# Patient Record
Sex: Male | Born: 1955
Health system: Southern US, Community
[De-identification: ages and names within clinical notes are randomized; demographics above are authoritative.]

## PROBLEM LIST (undated history)

## (undated) DIAGNOSIS — Z87442 Personal history of urinary calculi: Secondary | ICD-10-CM

## (undated) DIAGNOSIS — I10 Essential (primary) hypertension: Secondary | ICD-10-CM

## (undated) DIAGNOSIS — M109 Gout, unspecified: Secondary | ICD-10-CM

## (undated) DIAGNOSIS — E7800 Pure hypercholesterolemia, unspecified: Secondary | ICD-10-CM

## (undated) HISTORY — PX: COLONOSCOPY: SHX174

## (undated) HISTORY — PX: CARDIAC CATHETERIZATION: SHX172

---

## 2003-12-22 ENCOUNTER — Ambulatory Visit (HOSPITAL_COMMUNITY): Admission: RE | Admit: 2003-12-22 | Discharge: 2003-12-22 | Payer: Self-pay | Admitting: Family Medicine

## 2005-03-25 ENCOUNTER — Ambulatory Visit (HOSPITAL_COMMUNITY): Admission: RE | Admit: 2005-03-25 | Discharge: 2005-03-25 | Payer: Self-pay | Admitting: General Surgery

## 2006-02-05 ENCOUNTER — Ambulatory Visit (HOSPITAL_COMMUNITY): Admission: RE | Admit: 2006-02-05 | Discharge: 2006-02-05 | Payer: Self-pay | Admitting: Family Medicine

## 2006-12-19 ENCOUNTER — Ambulatory Visit (HOSPITAL_COMMUNITY): Admission: RE | Admit: 2006-12-19 | Discharge: 2006-12-19 | Payer: Self-pay | Admitting: *Deleted

## 2009-05-13 ENCOUNTER — Emergency Department (HOSPITAL_COMMUNITY): Admission: EM | Admit: 2009-05-13 | Discharge: 2009-05-13 | Payer: Self-pay | Admitting: Emergency Medicine

## 2010-03-13 IMAGING — CT CT PELVIS W/O CM
1 of 2 series · 8 of 32 positions shown, 13 images · non-contrast
Comparison: None.

CT ABDOMEN

CLINICAL DATA: Right-sided flank pain.  History of urinary tract
calculi.

CT ABDOMEN AND PELVIS WITHOUT CONTRAST 05/13/2009:
TECHNIQUE: Multidetector CT imaging of the abdomen and pelvis was
performed following the standard protocol without intravenous
contrast.

[Series 2: standard/full over (age)lbs 5.0 · axial · 0.73mm/px · z∈[+652,+1036]mm · 8 of 100 slices shown, 13 images]
[im 12/100  soft-tissue]
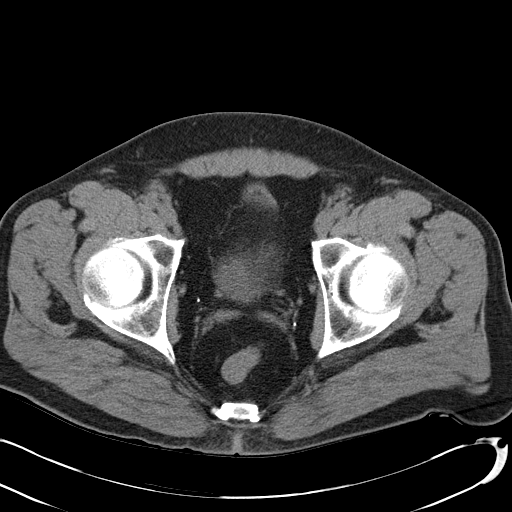
[im 12/100  bone]
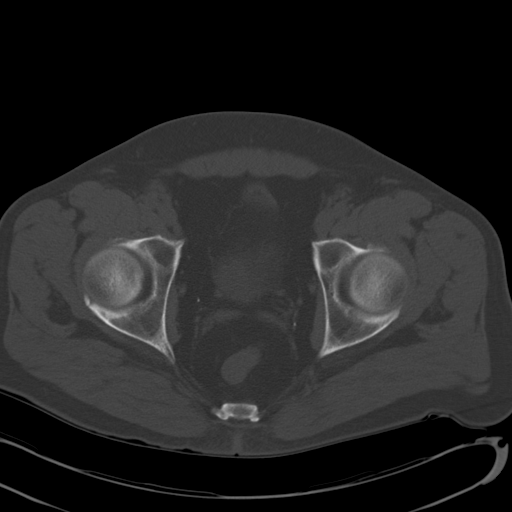
[im 23/100  soft-tissue]
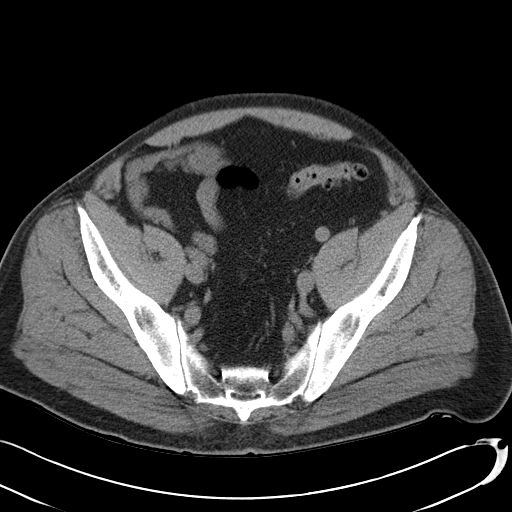
[im 34/100  soft-tissue]
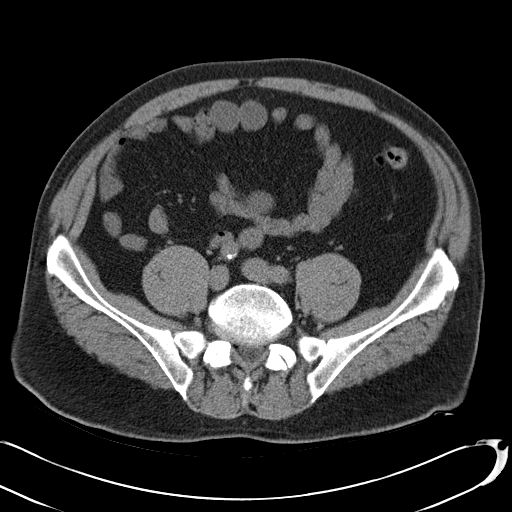
[im 45/100  soft-tissue]
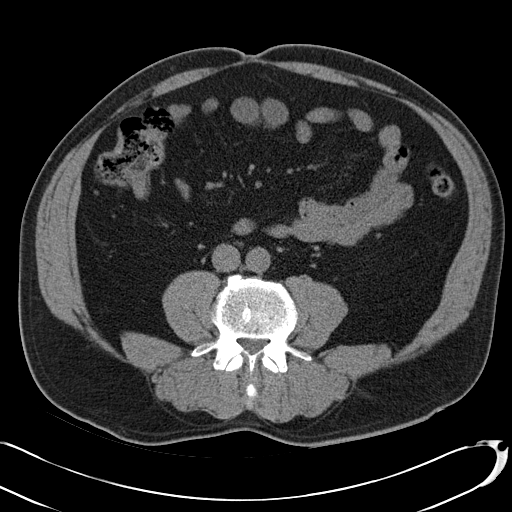
[im 56/100  soft-tissue]
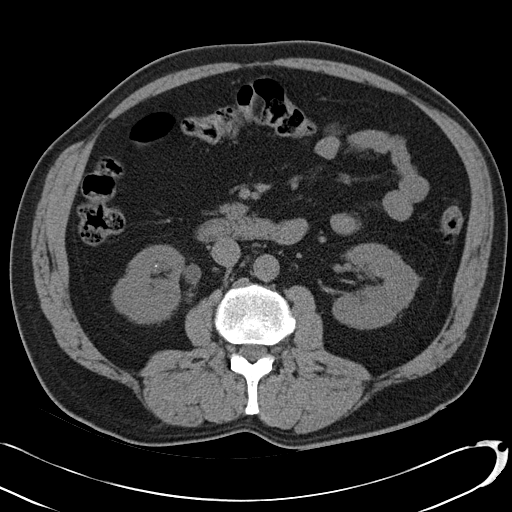
[im 56/100  lung]
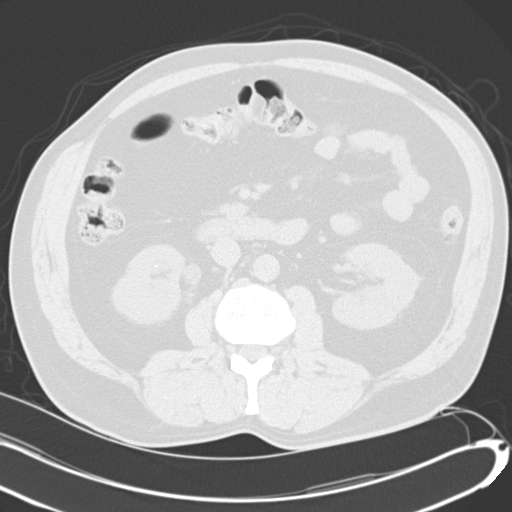
[im 67/100  soft-tissue]
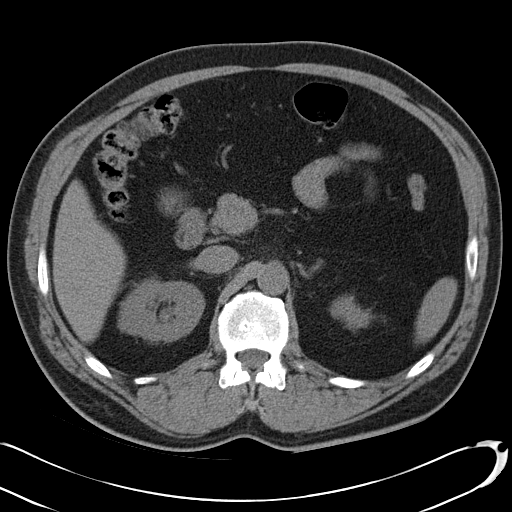
[im 67/100  lung]
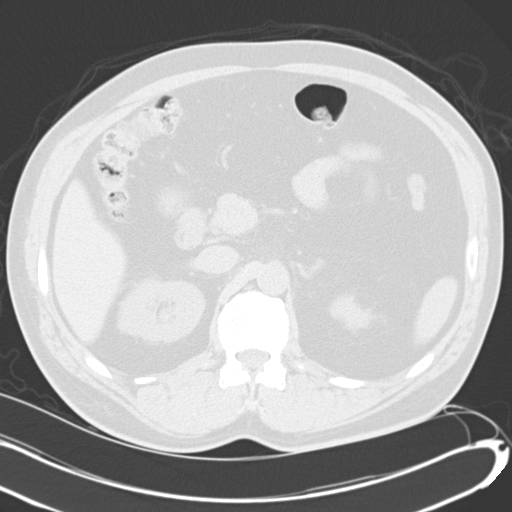
[im 78/100  soft-tissue]
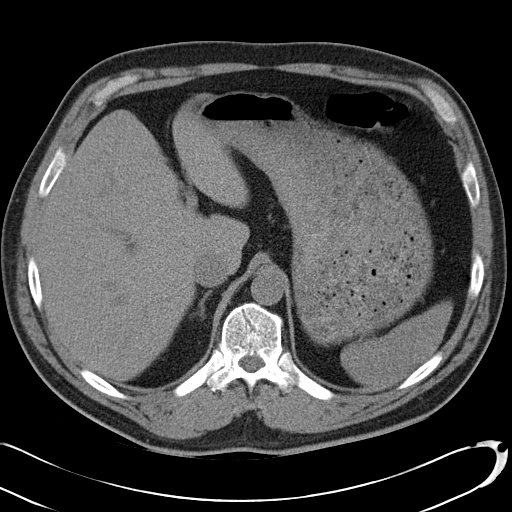
[im 78/100  lung]
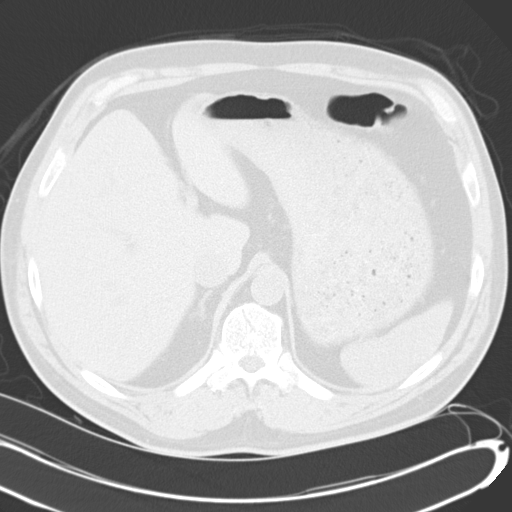
[im 89/100  soft-tissue]
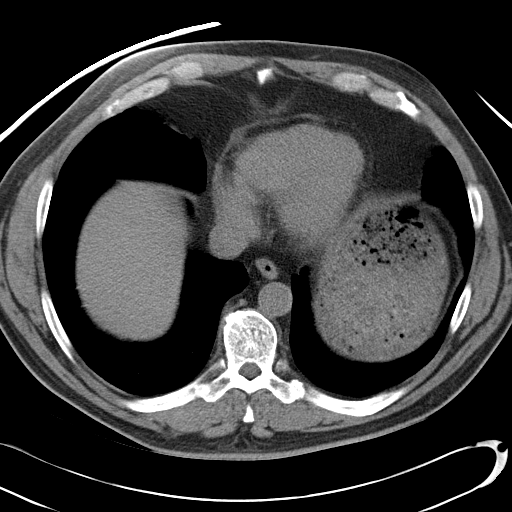
[im 89/100  lung]
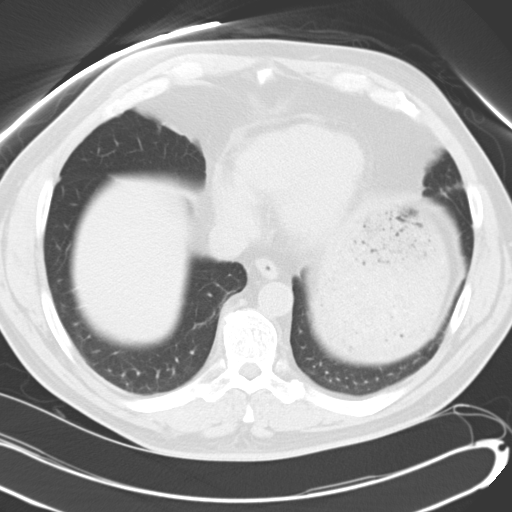

[8 of 32 positions shown; findings below may reference images not displayed]

FINDINGS: Approximate 4 mm proximal right ureteral calculus,
causing moderate right hydronephrosis, right renal edema, and mild
right perinephric edema.  Tiny (approximate 1-2 mm) calculus in a
lower pole calix of the right kidney.  Similar tiny calculi in
adjacent lower pole calyces of the left kidney and in a mid left
renal calix. No proximal left ureteral calculi.  No evidence of
left upper urinary tract obstruction.  Within the limits of the
unenhanced technique, no focal parenchymal abnormality involving
either kidney.

Normal unenhanced appearance of the liver, spleen, pancreas,
adrenal glands, and gallbladder.  No biliary ductal dilation.
Large amount of food within the stomach which is otherwise
unremarkable.  Visualized small bowel unremarkable.  Descending
colon diverticulosis without evidence of acute diverticulitis.
Mild to moderate abdominal aortic atherosclerosis without aneurysm.
No significant lymphadenopathy.  No ascites.  Visualized lung bases
clear apart from minimal scarring in the lingula.  Bone window
images demonstrate mild degenerative changes in the lumbar spine
and likely lower lumbar spinal stenosis related to congenitally
short pedicles.
IMPRESSION: 1.  Obstructing approximate 4 mm proximal right ureteral calculus.
2.  Tiny bilateral renal calculi.  No evidence of left upper
urinary tract obstruction.
3.  Descending colon diverticulosis.

CT PELVIS
FINDINGS: No distal ureteral calculi on either side.  Urinary
bladder decompressed and unremarkable.  Numerous pelvic
phleboliths.  Sigmoid colon diverticulosis without evidence of
acute diverticulitis.  Visualized small bowel unremarkable.  Mild
iliofemoral atherosclerosis.  No significant lymphadenopathy.  No
ascites.  Bone window images demonstrate mild degenerative changes
in the sacroiliac joints.
IMPRESSION: 1.  No distal ureteral calculi on either side.
2.  No acute abnormalities in the pelvis.
3.  Sigmoid colon diverticulosis.

## 2010-03-31 ENCOUNTER — Emergency Department (HOSPITAL_BASED_OUTPATIENT_CLINIC_OR_DEPARTMENT_OTHER): Admission: EM | Admit: 2010-03-31 | Discharge: 2010-03-31 | Payer: Self-pay | Admitting: Emergency Medicine

## 2010-10-10 LAB — CBC
Platelets: 236 10*3/uL (ref 150–400)
WBC: 8.5 10*3/uL (ref 4.0–10.5)

## 2010-10-10 LAB — URINALYSIS, ROUTINE W REFLEX MICROSCOPIC
Bilirubin Urine: NEGATIVE
Leukocytes, UA: NEGATIVE
Specific Gravity, Urine: 1.015 (ref 1.005–1.030)
Urobilinogen, UA: 0.2 mg/dL (ref 0.0–1.0)
pH: 7 (ref 5.0–8.0)

## 2010-10-10 LAB — BASIC METABOLIC PANEL
BUN: 17 mg/dL (ref 6–23)
Calcium: 9.1 mg/dL (ref 8.4–10.5)
Chloride: 99 mEq/L (ref 96–112)
Creatinine, Ser: 1.22 mg/dL (ref 0.4–1.5)
GFR calc Af Amer: >60 mL/min (ref >60)
GFR calc non Af Amer: >60 mL/min (ref >60)
Glucose, Bld: 115 mg/dL — ABNORMAL HIGH (ref 70–99)
Sodium: 136 mEq/L (ref 135–145)

## 2010-10-10 LAB — DIFFERENTIAL
Basophils Relative: 1 % (ref 0–1)
Eosinophils Absolute: 0.2 10*3/uL (ref 0.0–0.7)
Eosinophils Relative: 3 % (ref 0–5)
Lymphs Abs: 2.8 10*3/uL (ref 0.7–4.0)

## 2010-10-10 LAB — URINE MICROSCOPIC-ADD ON

## 2010-11-23 NOTE — H&P (Signed)
NAME:  Spencer Serrano, Spencer Serrano NO.:  [id number]   MEDICAL RECORD NO.:  [id number]           PATIENT TYPE:   LOCATION:                                 FACILITY:   PHYSICIAN:  Dalia Heading, M.D.  DATE OF BIRTH:  12/31/1955   DATE OF ADMISSION:  DATE OF DISCHARGE:  LH                                HISTORY & PHYSICAL   CHIEF COMPLAINT:  Need for screening colonoscopy.   HISTORY OF PRESENT ILLNESS:  The patient is a 55 year old white male who was  referred for endoscopic evaluation.  He needs his colonoscopy for screening  purposes.  No abdominal pain, weight loss, nausea, vomiting, diarrhea,  constipation, melena or hematochezia has been noted.  He has never had a  colonoscopy.  There is no family history of colon carcinoma.   PAST MEDICAL HISTORY:  Includes hypertension.   PAST SURGICAL HISTORY:  Unremarkable.   CURRENT MEDICATIONS:  A baby aspirin, lisinopril, Vytorin.   ALLERGIES:  PENICILLIN.   REVIEW OF SYSTEMS:  Noncontributory.   PHYSICAL EXAMINATION:  GENERAL APPEARANCE:  The patient is a well-developed,  well-nourished white male in no acute distress.  VITAL SIGNS:  He is afebrile, and the vital signs are stable.  LUNGS:  Clear to auscultation with equal breath sounds bilaterally.  HEART:  Reveals a regular rate and rhythm without S3, S4 or murmurs.  ABDOMEN:  Soft, nontender, nondistended.  No hepatosplenomegaly or masses  are noted.  RECTAL:  Examination was deferred to the procedure.   IMPRESSION:  Need for screening colonoscopy.   PLAN:  The patient is scheduled for a colonoscopy on March 25, 2005.  The risks and benefits of the procedure including bleeding and perforation  were fully explained to the patient, who gave informed consent.      Dalia Heading, M.D.  Electronically Signed     MAJ/MEDQ  D:  03/12/2005  T:  03/12/2005  Job:  [id number]   cc:   Dalia Heading, M.D.  [address] [place]  [place]  [place] [address]  Fax:  [phone]   Corrie Mckusick, M.D.  Fax: [phone]

## 2014-09-07 ENCOUNTER — Telehealth: Payer: Self-pay

## 2014-09-07 NOTE — Telephone Encounter (Signed)
Pt received letter from DS to set up his colonoscopy. He can be reached at [phone] and will have to leave to pick up his grandchild from school around 230pm.

## 2014-09-07 NOTE — Telephone Encounter (Signed)
PT is scheduled for OV with Neil Crouch, PA on 09/19/2014 at 10:00 AM. He said he had a colonoscopy about 10 years ago and no problems. Looks like Dr. Arnoldo Morale did [facility].  Will call medical records for the colonoscopy report.  PT was referred by Dr. Hilma Favors and referral is with his appt info.

## 2014-09-08 NOTE — Telephone Encounter (Signed)
Requested the colonoscopy report from [place] [facility] S[facility]).

## 2014-09-19 ENCOUNTER — Ambulatory Visit: Payer: Self-pay | Admitting: Gastroenterology

## 2014-09-20 ENCOUNTER — Ambulatory Visit (INDEPENDENT_AMBULATORY_CARE_PROVIDER_SITE_OTHER): Payer: 59 | Admitting: Gastroenterology

## 2014-09-20 ENCOUNTER — Encounter: Payer: Self-pay | Admitting: Gastroenterology

## 2014-09-20 ENCOUNTER — Other Ambulatory Visit: Payer: Self-pay

## 2014-09-20 VITALS — BP 125/79 | HR 64 | Temp 98.3°F | Ht 74.0 in | Wt 237.4 lb

## 2014-09-20 DIAGNOSIS — Z1211 Encounter for screening for malignant neoplasm of colon: Secondary | ICD-10-CM

## 2014-09-20 MED ORDER — PEG-KCL-NACL-NASULF-NA ASC-C 100 G PO SOLR: 1.0000 | ORAL | Status: DC

## 2014-09-20 NOTE — Patient Instructions (Signed)
We have triaged you today and refunded your copay.   Colonoscopy to be scheduled with Dr. Gala Romney.

## 2014-09-20 NOTE — Progress Notes (Signed)
Gastroenterology Pre-Procedure Review  Request Date: Requesting Physician: Hilma Favors  PATIENT REVIEW QUESTIONS: The patient responded to the following health history questions as indicated:    1. Diabetes Melitis: NO 2. Joint replacements in the past 12 months: no 3. Major health problems in the past 3 months: no 4. Has an artificial valve or MVP: no 5. Has a defibrillator: no 6. Has been advised in past to take antibiotics in advance of a procedure like teeth cleaning: no 7. Family history: No 8. Alcohol: NO  MEDICATIONS & ALLERGIES:    Patient reports the following regarding taking any blood thinners:   Plavix? NO Aspirin? YES, BABY ASA 81 MG Coumadin? NO  Patient confirms/reports the following medications:  Current Outpatient Prescriptions  Medication Sig Dispense Refill  . allopurinol (ZYLOPRIM) 100 MG tablet Take 100 mg by mouth daily.    Marland Kitchen aspirin 81 MG tablet Take 81 mg by mouth daily.    Marland Kitchen lisinopril (PRINIVIL,ZESTRIL) 20 MG tablet Take 20 mg by mouth 2 (two) times daily.    . simvastatin (ZOCOR) 80 MG tablet Take 80 mg by mouth daily. 1/2 a pill once a day     No current facility-administered medications for this visit.    Patient confirms/reports the following allergies:  Allergies  Allergen Reactions  . Penicillins Other (See Comments)    Passed out after getting penicillin shot.    No orders of the defined types were placed in this encounter.    AUTHORIZATION INFORMATION Primary Insurance: [facility]  ID #: [id number],  Group #: [id number] Pre-Cert / Josem Kaufmann required: yes Pre-Cert / Auth #: [id number]   SCHEDULE INFORMATION: Procedure has been scheduled as follows:  Date: 10/17/14, Time: 08:30 am    This Gastroenterology Pre-Precedure Review Form is being routed to the following provider(s):   RMR  Pt is set up for TCS on 10/17/14 @8 :30 with RMR

## 2014-09-20 NOTE — Progress Notes (Signed)
Due for routine screening colonoscopy. Last one in 2006 and normal. Patient without any lower GI or upper GI symptoms. Will triage for colonoscopy instead of OV due to office protocol.

## 2014-09-20 NOTE — Progress Notes (Signed)
Appropriate for colonoscopy.

## 2014-10-17 ENCOUNTER — Encounter (HOSPITAL_COMMUNITY)
Admission: RE | Disposition: A | Discharge status: Home / Self Care | Payer: Self-pay | Source: Ambulatory Visit | Attending: Internal Medicine

## 2014-10-17 ENCOUNTER — Ambulatory Visit (HOSPITAL_COMMUNITY)
Admission: RE | Admit: 2014-10-17 | Discharge: 2014-10-17 | Disposition: A | Discharge status: Home / Self Care | Payer: 59 | Source: Ambulatory Visit | Attending: Internal Medicine | Admitting: Internal Medicine

## 2014-10-17 ENCOUNTER — Encounter (HOSPITAL_COMMUNITY): Payer: Self-pay | Admitting: *Deleted

## 2014-10-17 DIAGNOSIS — D125 Benign neoplasm of sigmoid colon: Secondary | ICD-10-CM

## 2014-10-17 DIAGNOSIS — E780 Pure hypercholesterolemia: Secondary | ICD-10-CM | POA: Insufficient documentation

## 2014-10-17 DIAGNOSIS — Z9861 Coronary angioplasty status: Secondary | ICD-10-CM | POA: Insufficient documentation

## 2014-10-17 DIAGNOSIS — D120 Benign neoplasm of cecum: Secondary | ICD-10-CM | POA: Diagnosis not present

## 2014-10-17 DIAGNOSIS — Z1211 Encounter for screening for malignant neoplasm of colon: Secondary | ICD-10-CM | POA: Diagnosis not present

## 2014-10-17 DIAGNOSIS — K5730 Diverticulosis of large intestine without perforation or abscess without bleeding: Secondary | ICD-10-CM | POA: Insufficient documentation

## 2014-10-17 DIAGNOSIS — Z880 Allergy status to penicillin: Secondary | ICD-10-CM | POA: Diagnosis not present

## 2014-10-17 DIAGNOSIS — D123 Benign neoplasm of transverse colon: Secondary | ICD-10-CM | POA: Insufficient documentation

## 2014-10-17 DIAGNOSIS — I10 Essential (primary) hypertension: Secondary | ICD-10-CM | POA: Diagnosis not present

## 2014-10-17 DIAGNOSIS — M109 Gout, unspecified: Secondary | ICD-10-CM | POA: Insufficient documentation

## 2014-10-17 DIAGNOSIS — Z7982 Long term (current) use of aspirin: Secondary | ICD-10-CM | POA: Diagnosis not present

## 2014-10-17 DIAGNOSIS — Z860100 Personal history of colon polyps, unspecified: Secondary | ICD-10-CM | POA: Insufficient documentation

## 2014-10-17 HISTORY — DX: Gout, unspecified: M10.9

## 2014-10-17 HISTORY — DX: Pure hypercholesterolemia, unspecified: E78.00

## 2014-10-17 HISTORY — PX: COLONOSCOPY: SHX5424

## 2014-10-17 HISTORY — DX: Essential (primary) hypertension: I10

## 2014-10-17 SURGERY — COLONOSCOPY
Anesthesia: Moderate Sedation

## 2014-10-17 MED ORDER — ONDANSETRON HCL 4 MG/2ML IJ SOLN
INTRAMUSCULAR | Status: DC | PRN
  Administered 2014-10-17: 4 mg via INTRAVENOUS

## 2014-10-17 MED ORDER — MEPERIDINE HCL 100 MG/ML IJ SOLN
INTRAMUSCULAR | Status: DC | PRN
  Administered 2014-10-17: 50 mg via INTRAVENOUS
  Administered 2014-10-17: 25 mg via INTRAVENOUS

## 2014-10-17 MED ORDER — STERILE WATER FOR IRRIGATION IR SOLN
Status: DC | PRN
  Administered 2014-10-17: 08:00:00

## 2014-10-17 MED ORDER — SODIUM CHLORIDE 0.9 % IV BOLUS (SEPSIS)
1000.0000 mL | Freq: Once | INTRAVENOUS | Status: AC
  Administered 2014-10-17: 1000 mL via INTRAVENOUS

## 2014-10-17 MED ORDER — MIDAZOLAM HCL 5 MG/5ML IJ SOLN
INTRAMUSCULAR | Status: DC | PRN
  Administered 2014-10-17 (×2): 1 mg via INTRAVENOUS
  Administered 2014-10-17: 2 mg via INTRAVENOUS

## 2014-10-17 MED ORDER — ONDANSETRON HCL 4 MG/2ML IJ SOLN
INTRAMUSCULAR | Status: AC
  Filled 2014-10-17: qty 2

## 2014-10-17 MED ORDER — MIDAZOLAM HCL 5 MG/5ML IJ SOLN
INTRAMUSCULAR | Status: AC
  Filled 2014-10-17: qty 10

## 2014-10-17 MED ORDER — MEPERIDINE HCL 100 MG/ML IJ SOLN
INTRAMUSCULAR | Status: AC
  Filled 2014-10-17: qty 2

## 2014-10-17 MED ORDER — SODIUM CHLORIDE 0.9 % IV SOLN
INTRAVENOUS | Status: DC
Start: 2014-10-17 — End: 2014-10-17
  Administered 2014-10-17: 08:00:00 via INTRAVENOUS

## 2014-10-17 NOTE — Discharge Instructions (Signed)
Polyp and diverticulosis information provided  Further recommendations to follow pending review of pathology report   Colonoscopy Discharge Instructions  Read the instructions outlined below and refer to this sheet in the next few weeks. These discharge instructions provide you with general information on caring for yourself after you leave the hospital. Your doctor may also give you specific instructions. While your treatment has been planned according to the most current medical practices available, unavoidable complications occasionally occur. If you have any problems or questions after discharge, call Dr. Gala Romney at [phone]. ACTIVITY  You may resume your regular activity, but move at a slower pace for the next 24 hours.   Take frequent rest periods for the next 24 hours.   Walking will help get rid of the air and reduce the bloated feeling in your belly (abdomen).   No driving for 24 hours (because of the medicine (anesthesia) used during the test).    Do not sign any important legal documents or operate any machinery for 24 hours (because of the anesthesia used during the test).  NUTRITION  Drink plenty of fluids.   You may resume your normal diet as instructed by your doctor.   Begin with a light meal and progress to your normal diet. Heavy or fried foods are harder to digest and may make you feel sick to your stomach (nauseated).   Avoid alcoholic beverages for 24 hours or as instructed.  MEDICATIONS  You may resume your normal medications unless your doctor tells you otherwise.  WHAT YOU CAN EXPECT TODAY  Some feelings of bloating in the abdomen.   Passage of more gas than usual.   Spotting of blood in your stool or on the toilet paper.  IF YOU HAD POLYPS REMOVED DURING THE COLONOSCOPY:  No aspirin products for 7 days or as instructed.   No alcohol for 7 days or as instructed.   Eat a soft diet for the next 24 hours.  FINDING OUT THE RESULTS OF YOUR TEST Not all  test results are available during your visit. If your test results are not back during the visit, make an appointment with your caregiver to find out the results. Do not assume everything is normal if you have not heard from your caregiver or the medical facility. It is important for you to follow up on all of your test results.  SEEK IMMEDIATE MEDICAL ATTENTION IF:  You have more than a spotting of blood in your stool.   Your belly is swollen (abdominal distention).   You are nauseated or vomiting.   You have a temperature over 101.   You have abdominal pain or discomfort that is severe or gets worse throughout the day.    Colon Polyps Polyps are lumps of extra tissue growing inside the body. Polyps can grow in the large intestine (colon). Most colon polyps are noncancerous (benign). However, some colon polyps can become cancerous over time. Polyps that are larger than a pea may be harmful. To be safe, caregivers remove and test all polyps. CAUSES  Polyps form when mutations in the genes cause your cells to grow and divide even though no more tissue is needed. RISK FACTORS There are a number of risk factors that can increase your chances of getting colon polyps. They include:  Being older than 50 years.  Family history of colon polyps or colon cancer.  Long-term colon diseases, such as colitis or Crohn disease.  Being overweight.  Smoking.  Being inactive.  Drinking too  much alcohol. SYMPTOMS  Most small polyps do not cause symptoms. If symptoms are present, they may include:  Blood in the stool. The stool may look dark red or black.  Constipation or diarrhea that lasts longer than 1 week. DIAGNOSIS People often do not know they have polyps until their caregiver finds them during a regular checkup. Your caregiver can use 4 tests to check for polyps:  Digital rectal exam. The caregiver wears gloves and feels inside the rectum. This test would find polyps only in the  rectum.  Barium enema. The caregiver puts a liquid called barium into your rectum before taking X-rays of your colon. Barium makes your colon look Draiden Mirsky. Polyps are dark, so they are easy to see in the X-ray pictures.  Sigmoidoscopy. A thin, flexible tube (sigmoidoscope) is placed into your rectum. The sigmoidoscope has a light and tiny camera in it. The caregiver uses the sigmoidoscope to look at the last third of your colon.  Colonoscopy. This test is like sigmoidoscopy, but the caregiver looks at the entire colon. This is the most common method for finding and removing polyps. TREATMENT  Any polyps will be removed during a sigmoidoscopy or colonoscopy. The polyps are then tested for cancer. PREVENTION  To help lower your risk of getting more colon polyps:  Eat plenty of fruits and vegetables. Avoid eating fatty foods.  Do not smoke.  Avoid drinking alcohol.  Exercise every day.  Lose weight if recommended by your caregiver.  Eat plenty of calcium and folate. Foods that are rich in calcium include milk, cheese, and broccoli. Foods that are rich in folate include chickpeas, kidney beans, and spinach. HOME CARE INSTRUCTIONS Keep all follow-up appointments as directed by your caregiver. You may need periodic exams to check for polyps. SEEK MEDICAL CARE IF: You notice bleeding during a bowel movement. Document Released: 03/20/2004 Document Revised: 09/16/2011 Document Reviewed: 09/03/2011 [facility] Patient Information 2015 [place], [place]. This information is not intended to replace advice given to you by your health care provider. Make sure you discuss any questions you have with your health care provider.   Diverticulosis Diverticulosis is the condition that develops when small pouches (diverticula) form in the wall of your colon. Your colon, or large intestine, is where water is absorbed and stool is formed. The pouches form when the inside layer of your colon pushes through weak  spots in the outer layers of your colon. CAUSES  No one knows exactly what causes diverticulosis. RISK FACTORS  Being older than 48. Your risk for this condition increases with age. Diverticulosis is rare in people younger than 40 years. By age 23, almost everyone has it.  Eating a low-fiber diet.  Being frequently constipated.  Being overweight.  Not getting enough exercise.  Smoking.  Taking over-the-counter pain medicines, like aspirin and ibuprofen. SYMPTOMS  Most people with diverticulosis do not have symptoms. DIAGNOSIS  Because diverticulosis often has no symptoms, health care providers often discover the condition during an exam for other colon problems. In many cases, a health care provider will diagnose diverticulosis while using a flexible scope to examine the colon (colonoscopy). TREATMENT  If you have never developed an infection related to diverticulosis, you may not need treatment. If you have had an infection before, treatment may include:  Eating more fruits, vegetables, and grains.  Taking a fiber supplement.  Taking a live bacteria supplement (probiotic).  Taking medicine to relax your colon. HOME CARE INSTRUCTIONS   Drink at least 6-8 glasses of  water each day to prevent constipation.  Try not to strain when you have a bowel movement.  Keep all follow-up appointments. If you have had an infection before:  Increase the fiber in your diet as directed by your health care provider or dietitian.  Take a dietary fiber supplement if your health care provider approves.  Only take medicines as directed by your health care provider. SEEK MEDICAL CARE IF:   You have abdominal pain.  You have bloating.  You have cramps.  You have not gone to the bathroom in 3 days. SEEK IMMEDIATE MEDICAL CARE IF:   Your pain gets worse.  Yourbloating becomes very bad.  You have a fever or chills, and your symptoms suddenly get worse.  You begin vomiting.  You  have bowel movements that are bloody or black. MAKE SURE YOU:  Understand these instructions.  Will watch your condition.  Will get help right away if you are not doing well or get worse. Document Released: 03/21/2004 Document Revised: 06/29/2013 Document Reviewed: 05/19/2013 [facility] Patient Information 2015 [place], [place]. This information is not intended to replace advice given to you by your health care provider. Make sure you discuss any questions you have with your health care provider.

## 2014-10-17 NOTE — H&P (Signed)
_0 @   Primary Care Physician:  Purvis Kilts, MD Primary Gastroenterologist:  Dr. Gala Romney  Pre-Procedure History & Physical: HPI:  Spencer Serrano is a 59 y.o. male is here for a screening colonoscopy. Average risk screening examination. Negative colonoscopy 10 years ago. No family history of colon cancer.  Past Medical History  Diagnosis Date  . Hypertension   . Hypercholesteremia   . Gout     Past Surgical History  Procedure Laterality Date  . Colonoscopy    . Cardiac catheterization      Prior to Admission medications   Medication Sig Start Date End Date Taking? Authorizing Provider  allopurinol (ZYLOPRIM) 100 MG tablet Take 100 mg by mouth daily.   Yes Historical Provider, MD  aspirin 81 MG tablet Take 81 mg by mouth daily.   Yes Historical Provider, MD  lisinopril (PRINIVIL,ZESTRIL) 20 MG tablet Take 20 mg by mouth 2 (two) times daily. 07/26/14  Yes Historical Provider, MD  peg 3350 powder (MOVIPREP) 100 G SOLR Take 1 kit (200 g total) by mouth as directed. 09/20/14  Yes Daneil Dolin, MD  simvastatin (ZOCOR) 80 MG tablet Take 40 mg by mouth daily. 1/2 a pill once a day   Yes Historical Provider, MD    Allergies as of 09/20/2014 - Review Complete 09/20/2014  Allergen Reaction Noted  . Penicillins Other (See Comments) 09/20/2014    History reviewed. No pertinent family history.  History   Social History  . Marital Status: Married    Spouse Name: N/A  . Number of Children: N/A  . Years of Education: N/A   Occupational History  . Not on file.   Social History Main Topics  . Smoking status: Never Smoker   . Smokeless tobacco: Not on file  . Alcohol Use: 0.0 oz/week    0 Standard drinks or equivalent per week     Comment: "Very Rarely"  . Drug Use: No  . Sexual Activity: Not on file   Other Topics Concern  . Not on file   Social History Narrative    Review of Systems: See HPI, otherwise negative ROS  Physical Exam: BP 122/83 mmHg  Pulse 64   Temp(Src) 97.9 F (36.6 C) (Oral)  Resp 20  Ht _1  (1.88 m)  Wt 237 lb (107.502 kg)  BMI 30.42 kg/m2  SpO2 95% General:   Alert,  Well-developed, well-nourished, pleasant and cooperative in NAD Head:  Normocephalic and atraumatic. Eyes:  Sclera clear, no icterus.   Conjunctiva pink. Ears:  Normal auditory acuity. Nose:  No deformity, discharge,  or lesions. Mouth:  No deformity or lesions, dentition normal. Neck:  Supple; no masses or thyromegaly. Lungs:  Clear throughout to auscultation.   No wheezes, crackles, or rhonchi. No acute distress. Heart:  Regular rate and rhythm; no murmurs, clicks, rubs,  or gallops. Abdomen:  Soft, nontender and nondistended. No masses, hepatosplenomegaly or hernias noted. Normal bowel sounds, without guarding, and without rebound.   Msk:  Symmetrical without gross deformities. Normal posture. Pulses:  Normal pulses noted. Extremities:  Without clubbing or edema. Neurologic:  Alert and  oriented x4;  grossly normal neurologically. Skin:  Intact without significant lesions or rashes. Cervical Nodes:  No significant cervical adenopathy. Psych:  Alert and cooperative. Normal mood and affect.  Impression/Plan: Spencer Serrano is now here to undergo a screening colonoscopy.  Average risk screening examination. Risks, benefits, limitations, imponderables and alternatives regarding colonoscopy have been reviewed with the patient. Questions have been answered.  All parties agreeable.     Notice:  This dictation was prepared with Dragon dictation along with smaller phrase technology. Any transcriptional errors that result from this process are unintentional and may not be corrected upon review.

## 2014-10-17 NOTE — Op Note (Signed)
[facility] [address] [place], [address]   COLONOSCOPY PROCEDURE REPORT  PATIENT: Spencer Serrano, Spencer Serrano  MR#: [id number] BIRTHDATE: 1955/07/14 , 43  yrs. old GENDER: male ENDOSCOPIST: R.  Garfield Cornea, MD FACP [facility] REFERRED CX:KGYJ Hilma Favors, M.D. PROCEDURE DATE:  11/13/14 PROCEDURE:   Colonoscopy with biopsy and Colonoscopy with snare polypectomy INDICATIONS:Average risk colorectal cancer screening examination. MEDICATIONS: Versed 4 mg IV and Demerol 75 mg IV in divided doses. Zofran 4 mg IV. ASA CLASS:       Class II  CONSENT: The risks, benefits, alternatives and imponderables including but not limited to bleeding, perforation as well as the possibility of a missed lesion have been reviewed.  The potential for biopsy, lesion removal, etc. have also been discussed. Questions have been answered.  All parties agreeable.  Please see the history and physical in the medical record for more information.  DESCRIPTION OF PROCEDURE:   After the risks benefits and alternatives of the procedure were thoroughly explained, informed consent was obtained.  The digital rectal exam revealed no abnormalities of the rectum.   The EC-[id number] ([id number])  endoscope was introduced through the anus and advanced to the cecum, which was identified by both the appendix and ileocecal valve. No adverse events experienced.   The quality of the prep was adequate  The instrument was then slowly withdrawn as the colon was fully examined.      COLON FINDINGS: Normal rectum.  Scattered left-sided diverticula; (1) 7 mm flat polyp adjacent to the appendiceal orifice and (1)diminutive polyp in the cecum.  there was (1) 5 mm polyp at the hepatic flexure and (1) 4 mm polyp in the mid sigmoid segment; otherwise, the remainder of the contents appear normal.  The above-mentioned polyps were hot snared, cold biopsy and cold snare removed, respectively.  Retroflexed views revealed  no abnormalities. .  Withdrawal time=18 minutes 0 seconds.  The scope was withdrawn and the procedure completed. COMPLICATIONS: There were no immediate complications.  ENDOSCOPIC IMPRESSION: Colonic diverticulosis. Multiple colonic polyps removed as described above.  RECOMMENDATIONS: Follow-up on pathology.  eSigned:  R. Garfield Cornea, MD Rosalita Chessman [facility] 11-13-14 8:54 AM   cc:  CPT CODES: ICD CODES:  The ICD and CPT codes recommended by this software are interpretations from the data that the clinical staff has captured with the software.  The verification of the translation of this report to the ICD and CPT codes and modifiers is the sole responsibility of the health care institution and practicing physician where this report was generated.  [employer] will not be held responsible for the validity of the ICD and CPT codes included on this report.  AMA assumes no liability for data contained or not contained herein. CPT is a Designer, television/film set of the [employer].  PATIENT NAME:  [name], [name] MR#: [id number]

## 2014-10-18 ENCOUNTER — Encounter (HOSPITAL_COMMUNITY): Payer: Self-pay | Admitting: Internal Medicine

## 2014-10-20 ENCOUNTER — Encounter: Payer: Self-pay | Admitting: Internal Medicine

## 2015-09-17 ENCOUNTER — Encounter (HOSPITAL_COMMUNITY): Payer: Self-pay | Admitting: *Deleted

## 2015-09-17 ENCOUNTER — Emergency Department (HOSPITAL_COMMUNITY)
Admission: EM | Admit: 2015-09-17 | Discharge: 2015-09-17 | Disposition: A | Discharge status: Home / Self Care | Payer: 59 | Attending: Emergency Medicine | Admitting: Emergency Medicine

## 2015-09-17 DIAGNOSIS — Z7982 Long term (current) use of aspirin: Secondary | ICD-10-CM | POA: Diagnosis not present

## 2015-09-17 DIAGNOSIS — S0101XA Laceration without foreign body of scalp, initial encounter: Secondary | ICD-10-CM | POA: Diagnosis not present

## 2015-09-17 DIAGNOSIS — Y999 Unspecified external cause status: Secondary | ICD-10-CM | POA: Insufficient documentation

## 2015-09-17 DIAGNOSIS — I10 Essential (primary) hypertension: Secondary | ICD-10-CM | POA: Insufficient documentation

## 2015-09-17 DIAGNOSIS — W2201XA Walked into wall, initial encounter: Secondary | ICD-10-CM | POA: Insufficient documentation

## 2015-09-17 DIAGNOSIS — E7800 Pure hypercholesterolemia, unspecified: Secondary | ICD-10-CM | POA: Insufficient documentation

## 2015-09-17 DIAGNOSIS — Z79899 Other long term (current) drug therapy: Secondary | ICD-10-CM | POA: Diagnosis not present

## 2015-09-17 DIAGNOSIS — S0990XA Unspecified injury of head, initial encounter: Secondary | ICD-10-CM | POA: Diagnosis present

## 2015-09-17 DIAGNOSIS — Y929 Unspecified place or not applicable: Secondary | ICD-10-CM | POA: Insufficient documentation

## 2015-09-17 DIAGNOSIS — Y9302 Activity, running: Secondary | ICD-10-CM | POA: Diagnosis not present

## 2015-09-17 MED ORDER — LIDOCAINE-EPINEPHRINE (PF) 2 %-1:200000 IJ SOLN
20.0000 mL | Freq: Once | INTRAMUSCULAR | Status: DC
  Filled 2015-09-17: qty 20

## 2015-09-17 NOTE — ED Notes (Signed)
Pt states that he was getting up, unsure if he was still asleep, ran into the wall causing him to fall backwards and hitting his head against the closet framing, pt has laceration noted to back of head, bleeding controlled

## 2015-09-17 NOTE — ED Notes (Signed)
Pt states understanding of care given and follow up instructions 

## 2015-09-17 NOTE — Discharge Instructions (Signed)
Laceration Care, Adult  A laceration is a cut that goes through all layers of the skin. The cut also goes into the tissue that is right under the skin. Some cuts heal on their own. Others need to be closed with stitches (sutures), staples, skin adhesive strips, or wound glue. Taking care of your cut lowers your risk of infection and helps your cut to heal better.  HOW TO TAKE CARE OF YOUR CUT  For stitches or staples:  · Keep the wound clean and dry.  · If you were given a bandage (dressing), you should change it at least one time per day or as told by your doctor. You should also change it if it gets wet or dirty.  · Keep the wound completely dry for the first 24 hours or as told by your doctor. After that time, you may take a shower or a bath. However, make sure that the wound is not soaked in water until after the stitches or staples have been removed.  · Clean the wound one time each day or as told by your doctor:    Wash the wound with soap and water.    Rinse the wound with water until all of the soap comes off.    Pat the wound dry with a clean towel. Do not rub the wound.  · After you clean the wound, put a thin layer of antibiotic ointment on it as told by your doctor. This ointment:    Helps to prevent infection.    Keeps the bandage from sticking to the wound.  · Have your stitches or staples removed as told by your doctor.  If your doctor used skin adhesive strips:   · Keep the wound clean and dry.  · If you were given a bandage, you should change it at least one time per day or as told by your doctor. You should also change it if it gets dirty or wet.  · Do not get the skin adhesive strips wet. You can take a shower or a bath, but be careful to keep the wound dry.  · If the wound gets wet, pat it dry with a clean towel. Do not rub the wound.  · Skin adhesive strips fall off on their own. You can trim the strips as the wound heals. Do not remove any strips that are still stuck to the wound. They will  fall off after a while.  If your doctor used wound glue:  · Try to keep your wound dry, but you may briefly wet it in the shower or bath. Do not soak the wound in water, such as by swimming.  · After you take a shower or a bath, gently pat the wound dry with a clean towel. Do not rub the wound.  · Do not do any activities that will make you really sweaty until the skin glue has fallen off on its own.  · Do not apply liquid, cream, or ointment medicine to your wound while the skin glue is still on.  · If you were given a bandage, you should change it at least one time per day or as told by your doctor. You should also change it if it gets dirty or wet.  · If a bandage is placed over the wound, do not let the tape for the bandage touch the skin glue.  · Do not pick at the glue. The skin glue usually stays on for 5-10 days. Then, it   falls off of the skin.  General Instructions   · To help prevent scarring, make sure to cover your wound with sunscreen whenever you are outside after stitches are removed, after adhesive strips are removed, or when wound glue stays in place and the wound is healed. Make sure to wear a sunscreen of at least 30 SPF.  · Take over-the-counter and prescription medicines only as told by your doctor.  · If you were given antibiotic medicine or ointment, take or apply it as told by your doctor. Do not stop using the antibiotic even if your wound is getting better.  · Do not scratch or pick at the wound.  · Keep all follow-up visits as told by your doctor. This is important.  · Check your wound every day for signs of infection. Watch for:    Redness, swelling, or pain.    Fluid, blood, or pus.  · Raise (elevate) the injured area above the level of your heart while you are sitting or lying down, if possible.  GET HELP IF:  · You got a tetanus shot and you have any of these problems at the injection site:    Swelling.    Very bad pain.    Redness.    Bleeding.  · You have a fever.  · A wound that was  closed breaks open.  · You notice a bad smell coming from your wound or your bandage.  · You notice something coming out of the wound, such as wood or glass.  · Medicine does not help your pain.  · You have more redness, swelling, or pain at the site of your wound.  · You have fluid, blood, or pus coming from your wound.  · You notice a change in the color of your skin near your wound.  · You need to change the bandage often because fluid, blood, or pus is coming from the wound.  · You start to have a new rash.  · You start to have numbness around the wound.  GET HELP RIGHT AWAY IF:  · You have very bad swelling around the wound.  · Your pain suddenly gets worse and is very bad.  · You notice painful lumps near the wound or on skin that is anywhere on your body.  · You have a red streak going away from your wound.  · The wound is on your hand or foot and you cannot move a finger or toe like you usually can.  · The wound is on your hand or foot and you notice that your fingers or toes look pale or bluish.     This information is not intended to replace advice given to you by your health care provider. Make sure you discuss any questions you have with your health care provider.     Document Released: 12/11/2007 Document Revised: 11/08/2014 Document Reviewed: 06/20/2014  Elsevier Interactive Patient Education ©2016 [employer]

## 2015-09-17 NOTE — ED Provider Notes (Signed)
CSN: [id number]     Arrival date & time 09/17/15  0407 History   First MD Initiated Contact with Patient 09/17/15 [id number]     Chief Complaint  Patient presents with  . Head Laceration      HPI Pt presents with a scalp laceration sustained after falling tonight and falling backwards into the door jam. No LOC. No neck pain or weakness of arms or legs. No other injury. Denies n/v. Pain is mild. Bleeding controlled at this time. No other complaints   Past Medical History  Diagnosis Date  . Hypertension   . Hypercholesteremia   . Gout    Past Surgical History  Procedure Laterality Date  . Colonoscopy    . Cardiac catheterization    . Colonoscopy N/A 10/17/2014    Procedure: COLONOSCOPY;  Surgeon: Daneil Dolin, MD;  Location: [facility];  Service: Endoscopy;  Laterality: N/A;  830   No family history on file. Social History  Substance Use Topics  . Smoking status: Never Smoker   . Smokeless tobacco: None  . Alcohol Use: 0.0 oz/week    0 Standard drinks or equivalent per week     Comment: "Very Rarely"    Review of Systems  All other systems reviewed and are negative.     Allergies  Penicillins  Home Medications   Prior to Admission medications   Medication Sig Start Date End Date Taking? Authorizing Provider  allopurinol (ZYLOPRIM) 100 MG tablet Take 100 mg by mouth daily.   Yes Historical Provider, MD  aspirin 81 MG tablet Take 81 mg by mouth daily.   Yes Historical Provider, MD  lisinopril (PRINIVIL,ZESTRIL) 20 MG tablet Take 20 mg by mouth 2 (two) times daily. 07/26/14  Yes Historical Provider, MD  simvastatin (ZOCOR) 80 MG tablet Take 40 mg by mouth daily. 1/2 a pill once a day   Yes Historical Provider, MD  peg 3350 powder (MOVIPREP) 100 G SOLR Take 1 kit (200 g total) by mouth as directed. 09/20/14   Daneil Dolin, MD   BP 142/88 mmHg  Pulse 56  Temp(Src) 98.5 F (36.9 C) (Oral)  Resp 16  Ht 6' 2"  (1.88 m)  Wt 235 lb (106.595 kg)  BMI 30.16 kg/m2  SpO2  95% Physical Exam  Constitutional: He is oriented to person, place, and time. He appears well-developed and well-nourished.  HENT:  Head: Normocephalic.  5cm posterior scalp laceration without bleeding. clean  Eyes: EOM are normal.  Neck: Normal range of motion. Neck supple.  c spine nontender  Pulmonary/Chest: Effort normal.  Abdominal: He exhibits no distension.  Musculoskeletal: Normal range of motion.  Neurological: He is alert and oriented to person, place, and time.  Psychiatric: He has a normal mood and affect.  Nursing note and vitals reviewed.   ED Course  .Marland KitchenLaceration Repair Performed by: Jola Schmidt Authorized by: Jola Schmidt Consent: Verbal consent obtained. Consent given by: patient Body area: head/neck Location details: scalp Laceration length: 5 cm Foreign bodies: no foreign bodies Tendon involvement: superficial Preparation: Patient was prepped and draped in the usual sterile fashion. Irrigation solution: saline Skin closure: staples Approximation: close Approximation difficulty: simple Patient tolerance: Patient tolerated the procedure well with no immediate complications   (including critical care time) Labs Review Labs Reviewed - No data to display  Imaging Review No results found. I have personally reviewed and evaluated these images and lab results as part of my medical decision-making.   EKG Interpretation None  MDM   Final diagnoses:  Scalp laceration, initial encounter  Head injury, initial encounter    Infection and head injury warnings. No indication for imaging. 10 days for staple removal    Jola Schmidt, MD 09/17/15 [phone]

## 2016-09-04 DIAGNOSIS — I10 Essential (primary) hypertension: Secondary | ICD-10-CM | POA: Diagnosis not present

## 2016-09-04 DIAGNOSIS — E782 Mixed hyperlipidemia: Secondary | ICD-10-CM | POA: Diagnosis not present

## 2016-09-04 DIAGNOSIS — Z1389 Encounter for screening for other disorder: Secondary | ICD-10-CM | POA: Diagnosis not present

## 2016-09-04 DIAGNOSIS — R7309 Other abnormal glucose: Secondary | ICD-10-CM | POA: Diagnosis not present

## 2016-09-04 DIAGNOSIS — Z0000 Encounter for general adult medical examination without abnormal findings: Secondary | ICD-10-CM | POA: Diagnosis not present

## 2016-09-04 DIAGNOSIS — Z6831 Body mass index (BMI) 31.0-31.9, adult: Secondary | ICD-10-CM | POA: Diagnosis not present

## 2016-09-18 DIAGNOSIS — E875 Hyperkalemia: Secondary | ICD-10-CM | POA: Diagnosis not present

## 2017-09-09 ENCOUNTER — Encounter: Payer: Self-pay | Admitting: Gastroenterology

## 2017-09-16 DIAGNOSIS — Z0000 Encounter for general adult medical examination without abnormal findings: Secondary | ICD-10-CM | POA: Diagnosis not present

## 2017-09-16 DIAGNOSIS — I10 Essential (primary) hypertension: Secondary | ICD-10-CM | POA: Diagnosis not present

## 2017-09-16 DIAGNOSIS — E6609 Other obesity due to excess calories: Secondary | ICD-10-CM | POA: Diagnosis not present

## 2017-09-16 DIAGNOSIS — R7309 Other abnormal glucose: Secondary | ICD-10-CM | POA: Diagnosis not present

## 2017-09-16 DIAGNOSIS — E782 Mixed hyperlipidemia: Secondary | ICD-10-CM | POA: Diagnosis not present

## 2017-09-16 DIAGNOSIS — Z6830 Body mass index (BMI) 30.0-30.9, adult: Secondary | ICD-10-CM | POA: Diagnosis not present

## 2017-09-16 DIAGNOSIS — Z1389 Encounter for screening for other disorder: Secondary | ICD-10-CM | POA: Diagnosis not present

## 2017-10-03 DIAGNOSIS — E875 Hyperkalemia: Secondary | ICD-10-CM | POA: Diagnosis not present

## 2017-11-03 ENCOUNTER — Encounter: Payer: Self-pay | Admitting: Gastroenterology

## 2017-11-19 ENCOUNTER — Ambulatory Visit (INDEPENDENT_AMBULATORY_CARE_PROVIDER_SITE_OTHER): Payer: Self-pay

## 2017-11-19 DIAGNOSIS — Z86010 Personal history of colonic polyps: Secondary | ICD-10-CM

## 2017-11-19 MED ORDER — PEG 3350-KCL-NA BICARB-NACL 420 G PO SOLR: 4000.0000 mL | ORAL | 0 refills | Status: DC

## 2017-11-19 NOTE — Progress Notes (Signed)
Gastroenterology Pre-Procedure Review  Request Date:11/19/17 Requesting Physician: 3 year recall  (last tcs 10/17/14 tubular adenoma RMR)  PATIENT REVIEW QUESTIONS: The patient responded to the following health history questions as indicated:    1. Diabetes Melitis: no 2. Joint replacements in the past 12 months: no 3. Major health problems in the past 3 months: no 4. Has an artificial valve or MVP: no 5. Has a defibrillator: no 6. Has been advised in past to take antibiotics in advance of a procedure like teeth cleaning: no 7. Family history of colon cancer: no  8. Alcohol Use: no 9. History of sleep apnea: no  10. History of coronary artery or other vascular stents placed within the last 12 months: no 11. History of any prior anesthesia complications: no    MEDICATIONS & ALLERGIES:    Patient reports the following regarding taking any blood thinners:   Plavix? no Aspirin? no Coumadin? no Brilinta? no Xarelto? no Eliquis? no Pradaxa? no Savaysa? no Effient? no  Patient confirms/reports the following medications:  Current Outpatient Medications  Medication Sig Dispense Refill  . allopurinol (ZYLOPRIM) 100 MG tablet Take 10 mg by mouth daily.     Marland Kitchen aspirin 81 MG tablet Take 81 mg by mouth daily.    Marland Kitchen lisinopril (PRINIVIL,ZESTRIL) 20 MG tablet Take 20 mg by mouth 2 (two) times daily.    . simvastatin (ZOCOR) 80 MG tablet Take 40 mg by mouth daily. 1/2 a pill once a day     No current facility-administered medications for this visit.     Patient confirms/reports the following allergies:  Allergies  Allergen Reactions  . Penicillins Other (See Comments)    Passed out after getting penicillin shot.    No orders of the defined types were placed in this encounter.   AUTHORIZATION INFORMATION Primary Insurance:BCBS  ,  ID #: Pre-Cert / Auth required: Pre-Cert / Auth #:    SCHEDULE INFORMATION: Procedure has been scheduled as follows:  Date: 01/14/18, Time:    Location: [facility]  This Gastroenterology Pre-Precedure Review Form is being routed to the following provider(s): Roseanne Kaufman NP

## 2017-11-19 NOTE — Patient Instructions (Signed)
Spencer Serrano   08-02-1955 MRN: [id number]    Procedure Date: 01/14/18 Time to register: 8:00 Place to register: [facility] Procedure Time: 9:00 Scheduled provider: R. Garfield Cornea, MD  PREPARATION FOR COLONOSCOPY WITH TRI-LYTE SPLIT PREP  Please notify us immediately if you are diabetic, take iron supplements, or if you are on Coumadin or any other blood thinners.     You will need to purchase 1 fleet enema and 1 box of Bisacodyl 54m tablets.   2 DAYS BEFORE PROCEDURE:  DATE: 01/12/18   DAY: Monday Begin clear liquid diet AFTER your lunch meal. NO SOLID FOODS after this point.  1 DAY BEFORE PROCEDURE:  DATE: 01/13/18   DAY: Tuesday Continue clear liquids the entire day - NO SOLID FOOD.     At 2:00 pm:  Take 2 Bisacodyl tablets.   At 4:00pm:  Start drinking your solution. Make sure you mix well per instructions on the bottle. Try to drink 1 (one) 8 ounce glass every 10-15 minutes until you have consumed HALF the jug. You should complete by 6:00pm.You must keep the left over solution refrigerated until completed next day.  Continue clear liquids. You must drink plenty of clear liquids to prevent dehyration and kidney failure.   If you take medications for your heart, blood pressure or breathing, you may take these medications with a small amount of clear liquid.    DAY OF PROCEDURE:   DATE: 01/14/18   DAY: Wednesday    Five hours before your procedure time @ 4:00am:  Finish remaining amout of bowel prep, drinking 1 (one) 8 ounce glass every 10-15 minutes until complete. You have two hours to consume remaining prep.   Three hours before your procedure time @6 :00am:  Nothing by mouth.   At least one hour before going to the hospital:  Give yourself one Fleet enema. You may take your morning medications with sip of water unless we have instructed otherwise.      Please see below for Dietary Information.  CLEAR LIQUIDS INCLUDE:  Water Jello (NOT red in color)    Ice Popsicles (NOT red in color)   Tea (sugar ok, no milk/cream) Powdered fruit flavored drinks  Coffee (sugar ok, no milk/cream) Gatorade/ Lemonade/ Kool-Aid  (NOT red in color)   Juice: apple, white grape, white cranberry Soft drinks  Clear bullion, consomme, broth (fat free beef/chicken/vegetable)  Carbonated beverages (any kind)  Strained chicken noodle soup Hard Candy   Remember: Clear liquids are liquids that will allow you to see your fingers on the other side of a clear glass. Be sure liquids are NOT red in color, and not cloudy, but CLEAR.  DO NOT EAT OR DRINK ANY OF THE FOLLOWING:  Dairy products of any kind   Cranberry juice Tomato juice / V8 juice   Grapefruit juice Orange juice     Red grape juice  Do not eat any solid foods, including such foods as: cereal, oatmeal, yogurt, fruits, vegetables, creamed soups, eggs, bread, crackers, pureed foods in a blender, etc.   HELPFUL HINTS FOR DRINKING PREP SOLUTION:   Make sure prep is extremely cold. Mix and refrigerate the the morning of the prep. You may also put in the freezer.   You may try mixing some Crystal Light or Country Time Lemonade if you prefer. Mix in small amounts; add more if necessary.  Try drinking through a straw  Rinse mouth with water or a mouthwash between glasses, to remove after-taste.  Try  sipping on a cold beverage /ice/ popsicles between glasses of prep.  Place a piece of sugar-free hard candy in mouth between glasses.  If you become nauseated, try consuming smaller amounts, or stretch out the time between glasses. Stop for 30-60 minutes, then slowly start back drinking.        OTHER INSTRUCTIONS  You will need a responsible adult at least 63 years of age to accompany you and drive you home. This person must remain in the waiting room during your procedure. The hospital will cancel your procedure if you do not have a responsible adult with you.   1. Wear loose fitting clothing that is  easily removed. 2. Leave jewelry and other valuables at home.  3. Remove all body piercing jewelry and leave at home. 4. Total time from sign-in until discharge is approximately 2-3 hours. 5. You should go home directly after your procedure and rest. You can resume normal activities the day after your procedure. 6. The day of your procedure you should not:  Drive  Make legal decisions  Operate machinery  Drink alcohol  Return to work   You may call the office (Dept: [phone]) before 5:00pm, or page the doctor on call [phone]) after 5:00pm, for further instructions, if necessary.   Insurance Information YOU WILL NEED TO CHECK WITH YOUR INSURANCE COMPANY FOR THE BENEFITS OF COVERAGE YOU HAVE FOR THIS PROCEDURE.  UNFORTUNATELY, NOT ALL INSURANCE COMPANIES HAVE BENEFITS TO COVER ALL OR PART OF THESE TYPES OF PROCEDURES.  IT IS YOUR RESPONSIBILITY TO CHECK YOUR BENEFITS, HOWEVER, WE WILL BE GLAD TO ASSIST YOU WITH ANY CODES YOUR INSURANCE COMPANY MAY NEED.    PLEASE NOTE THAT MOST INSURANCE COMPANIES WILL NOT COVER A SCREENING COLONOSCOPY FOR PEOPLE UNDER THE AGE OF 50  IF YOU HAVE BCBS INSURANCE, YOU MAY HAVE BENEFITS FOR A SCREENING COLONOSCOPY BUT IF POLYPS ARE FOUND THE DIAGNOSIS WILL CHANGE AND THEN YOU MAY HAVE A DEDUCTIBLE THAT WILL NEED TO BE MET. SO PLEASE MAKE SURE YOU CHECK YOUR BENEFITS FOR A SCREENING COLONOSCOPY AS WELL AS A DIAGNOSTIC COLONOSCOPY.

## 2017-11-20 NOTE — Progress Notes (Signed)
Appropriate.

## 2017-11-25 ENCOUNTER — Telehealth: Payer: Self-pay

## 2017-11-25 NOTE — Telephone Encounter (Signed)
Pt called to say when he was seen last week, he was taking Allopurinol 100 mg and not 10 mg. Pt was notified that we have 100 mg daily in our system.

## 2018-01-14 ENCOUNTER — Other Ambulatory Visit: Payer: Self-pay

## 2018-01-14 ENCOUNTER — Ambulatory Visit (HOSPITAL_COMMUNITY)
Admission: RE | Admit: 2018-01-14 | Discharge: 2018-01-14 | Disposition: A | Discharge status: Home / Self Care | Payer: BLUE CROSS/BLUE SHIELD | Source: Ambulatory Visit | Attending: Internal Medicine | Admitting: Internal Medicine

## 2018-01-14 ENCOUNTER — Encounter (HOSPITAL_COMMUNITY)
Admission: RE | Disposition: A | Discharge status: Home / Self Care | Payer: Self-pay | Source: Ambulatory Visit | Attending: Internal Medicine

## 2018-01-14 ENCOUNTER — Encounter (HOSPITAL_COMMUNITY): Payer: Self-pay

## 2018-01-14 DIAGNOSIS — E7800 Pure hypercholesterolemia, unspecified: Secondary | ICD-10-CM | POA: Diagnosis not present

## 2018-01-14 DIAGNOSIS — Z87442 Personal history of urinary calculi: Secondary | ICD-10-CM | POA: Insufficient documentation

## 2018-01-14 DIAGNOSIS — I10 Essential (primary) hypertension: Secondary | ICD-10-CM | POA: Diagnosis not present

## 2018-01-14 DIAGNOSIS — Z86010 Personal history of colonic polyps: Secondary | ICD-10-CM | POA: Insufficient documentation

## 2018-01-14 DIAGNOSIS — M109 Gout, unspecified: Secondary | ICD-10-CM | POA: Insufficient documentation

## 2018-01-14 DIAGNOSIS — K5730 Diverticulosis of large intestine without perforation or abscess without bleeding: Secondary | ICD-10-CM | POA: Insufficient documentation

## 2018-01-14 DIAGNOSIS — Z7982 Long term (current) use of aspirin: Secondary | ICD-10-CM | POA: Insufficient documentation

## 2018-01-14 DIAGNOSIS — D1779 Benign lipomatous neoplasm of other sites: Secondary | ICD-10-CM | POA: Diagnosis not present

## 2018-01-14 DIAGNOSIS — Z1211 Encounter for screening for malignant neoplasm of colon: Secondary | ICD-10-CM | POA: Diagnosis not present

## 2018-01-14 DIAGNOSIS — Z79899 Other long term (current) drug therapy: Secondary | ICD-10-CM | POA: Diagnosis not present

## 2018-01-14 HISTORY — DX: Personal history of urinary calculi: Z87.442

## 2018-01-14 HISTORY — PX: COLONOSCOPY: SHX5424

## 2018-01-14 SURGERY — COLONOSCOPY
Anesthesia: Moderate Sedation

## 2018-01-14 MED ORDER — MEPERIDINE HCL 100 MG/ML IJ SOLN
INTRAMUSCULAR | Status: DC | PRN
  Administered 2018-01-14: 25 mg via INTRAVENOUS

## 2018-01-14 MED ORDER — ONDANSETRON HCL 4 MG/2ML IJ SOLN
INTRAMUSCULAR | Status: AC
  Filled 2018-01-14: qty 2

## 2018-01-14 MED ORDER — MIDAZOLAM HCL 5 MG/5ML IJ SOLN
INTRAMUSCULAR | Status: AC
  Filled 2018-01-14: qty 10

## 2018-01-14 MED ORDER — MEPERIDINE HCL 100 MG/ML IJ SOLN
INTRAMUSCULAR | Status: AC
  Filled 2018-01-14: qty 2

## 2018-01-14 MED ORDER — MIDAZOLAM HCL 5 MG/5ML IJ SOLN
INTRAMUSCULAR | Status: DC | PRN
  Administered 2018-01-14 (×2): 1 mg via INTRAVENOUS
  Administered 2018-01-14: 2 mg via INTRAVENOUS

## 2018-01-14 MED ORDER — STERILE WATER FOR IRRIGATION IR SOLN
Status: DC | PRN
  Administered 2018-01-14: 09:00:00

## 2018-01-14 MED ORDER — ONDANSETRON HCL 4 MG/2ML IJ SOLN
INTRAMUSCULAR | Status: DC | PRN
  Administered 2018-01-14: 4 mg via INTRAVENOUS

## 2018-01-14 MED ORDER — SODIUM CHLORIDE 0.9 % IV SOLN
INTRAVENOUS | Status: DC
  Administered 2018-01-14: 09:00:00 via INTRAVENOUS

## 2018-01-14 NOTE — Discharge Instructions (Signed)
Colonoscopy Discharge Instructions  Read the instructions outlined below and refer to this sheet in the next few weeks. These discharge instructions provide you with general information on caring for yourself after you leave the hospital. Your doctor may also give you specific instructions. While your treatment has been planned according to the most current medical practices available, unavoidable complications occasionally occur. If you have any problems or questions after discharge, call Dr. Gala Romney at [phone]. ACTIVITY  You may resume your regular activity, but move at a slower pace for the next 24 hours.   Take frequent rest periods for the next 24 hours.   Walking will help get rid of the air and reduce the bloated feeling in your belly (abdomen).   No driving for 24 hours (because of the medicine (anesthesia) used during the test).    Do not sign any important legal documents or operate any machinery for 24 hours (because of the anesthesia used during the test).  NUTRITION  Drink plenty of fluids.   You may resume your normal diet as instructed by your doctor.   Begin with a light meal and progress to your normal diet. Heavy or fried foods are harder to digest and may make you feel sick to your stomach (nauseated).   Avoid alcoholic beverages for 24 hours or as instructed.  MEDICATIONS  You may resume your normal medications unless your doctor tells you otherwise.  WHAT YOU CAN EXPECT TODAY  Some feelings of bloating in the abdomen.   Passage of more gas than usual.   Spotting of blood in your stool or on the toilet paper.  IF YOU HAD POLYPS REMOVED DURING THE COLONOSCOPY:  No aspirin products for 7 days or as instructed.   No alcohol for 7 days or as instructed.   Eat a soft diet for the next 24 hours.  FINDING OUT THE RESULTS OF YOUR TEST Not all test results are available during your visit. If your test results are not back during the visit, make an appointment  with your caregiver to find out the results. Do not assume everything is normal if you have not heard from your caregiver or the medical facility. It is important for you to follow up on all of your test results.  SEEK IMMEDIATE MEDICAL ATTENTION IF:  You have more than a spotting of blood in your stool.   Your belly is swollen (abdominal distention).   You are nauseated or vomiting.   You have a temperature over 101.   You have abdominal pain or discomfort that is severe or gets worse throughout the day.    Diverticulosis Diverticulosis is a condition that develops when small pouches (diverticula) form in the wall of the large intestine (colon). The colon is where water is absorbed and stool is formed. The pouches form when the inside layer of the colon pushes through weak spots in the outer layers of the colon. You may have a few pouches or many of them. What are the causes? The cause of this condition is not known. What increases the risk? The following factors may make you more likely to develop this condition:  Being older than age 35. Your risk for this condition increases with age. Diverticulosis is rare among people younger than age 9. By age 63, many people have it.  Eating a low-fiber diet.  Having frequent constipation.  Being overweight.  Not getting enough exercise.  Smoking.  Taking over-the-counter pain medicines, like aspirin and ibuprofen.  Having  a family history of diverticulosis.  What are the signs or symptoms? In most people, there are no symptoms of this condition. If you do have symptoms, they may include:  Bloating.  Cramps in the abdomen.  Constipation or diarrhea.  Pain in the lower left side of the abdomen.  How is this diagnosed? This condition is most often diagnosed during an exam for other colon problems. Because diverticulosis usually has no symptoms, it often cannot be diagnosed independently. This condition may be diagnosed  by:  Using a flexible scope to examine the colon (colonoscopy).  Taking an X-ray of the colon after dye has been put into the colon (barium enema).  Doing a CT scan.  How is this treated? You may not need treatment for this condition if you have never developed an infection related to diverticulosis. If you have had an infection before, treatment may include:  Eating a high-fiber diet. This may include eating more fruits, vegetables, and grains.  Taking a fiber supplement.  Taking a live bacteria supplement (probiotic).  Taking medicine to relax your colon.  Taking antibiotic medicines.  Follow these instructions at home:  Drink 6-8 glasses of water or more each day to prevent constipation.  Try not to strain when you have a bowel movement.  If you have had an infection before: ? Eat more fiber as directed by your health care provider or your diet and nutrition specialist (dietitian). ? Take a fiber supplement or probiotic, if your health care provider approves.  Take over-the-counter and prescription medicines only as told by your health care provider.  If you were prescribed an antibiotic, take it as told by your health care provider. Do not stop taking the antibiotic even if you start to feel better.  Keep all follow-up visits as told by your health care provider. This is important. Contact a health care provider if:  You have pain in your abdomen.  You have bloating.  You have cramps.  You have not had a bowel movement in 3 days. Get help right away if:  Your pain gets worse.  Your bloating becomes very bad.  You have a fever or chills, and your symptoms suddenly get worse.  You vomit.  You have bowel movements that are bloody or black.  You have bleeding from your rectum. Summary  Diverticulosis is a condition that develops when small pouches (diverticula) form in the wall of the large intestine (colon).  You may have a few pouches or many of  them.  This condition is most often diagnosed during an exam for other colon problems.  If you have had an infection related to diverticulosis, treatment may include increasing the fiber in your diet, taking supplements, or taking medicines. This information is not intended to replace advice given to you by your health care provider. Make sure you discuss any questions you have with your health care provider. Document Released: 03/21/2004 Document Revised: 05/13/2016 Document Reviewed: 05/13/2016 Elsevier Interactive Patient Education  2017 [employer].    Diverticulosis information provided  Repeat colonoscopy in 5 years

## 2018-01-14 NOTE — Op Note (Signed)
[facility] Patient Name: Spencer Serrano Procedure Date: 01/14/2018 9:09 AM MRN: [id number] Date of Birth: 04/08/1956 Attending MD: Norvel Richards , MD CSN: [id number] Age: 62 Admit Type: Outpatient Procedure:                Colonoscopy Indications:              High risk colon cancer surveillance: Personal                            history of colonic polyps Providers:                Norvel Richards, MD, Otis Peak B. Gwenlyn Perking RN, RN,                            Aram Candela Referring MD:              Medicines:                Midazolam 4 mg IV, Meperidine 25 mg IV Complications:            No immediate complications. Estimated Blood Loss:     Estimated blood loss: none. Procedure:                Pre-Anesthesia Assessment:                           - Prior to the procedure, a History and Physical                            was performed, and patient medications and                            allergies were reviewed. The patient's tolerance of                            previous anesthesia was also reviewed. The risks                            and benefits of the procedure and the sedation                            options and risks were discussed with the patient.                            All questions were answered, and informed consent                            was obtained. Prior Anticoagulants: The patient has                            taken no previous anticoagulant or antiplatelet                            agents. ASA Grade Assessment: II - A patient with  mild systemic disease. After reviewing the risks                            and benefits, the patient was deemed in                            satisfactory condition to undergo the procedure.                           After obtaining informed consent, the colonoscope                            was passed under direct vision. Throughout the                            procedure, the  patient's blood pressure, pulse, and                            oxygen saturations were monitored continuously. The                            [id number]-[id number] ([id number]) scope was introduced through                            the anus and advanced to the the cecum, identified                            by appendiceal orifice and ileocecal valve. Scope In: 9:25:32 AM Scope Out: 6:76:19 AM Scope Withdrawal Time: 0 hours 8 minutes 9 seconds  Total Procedure Duration: 0 hours 11 minutes 15 seconds  Findings:      The perianal and digital rectal examinations were normal.      Scattered small and large-mouthed diverticula were found in the entire       colon. 2 cm yellowish submucosal nodule of hepatic flexure?"positive       pillow sign consistent with a lipoma.      No other significant abnormalities were identified in a careful       examination of the remainder of the colon.      The retroflexed view of the distal rectum and anal verge was normal and       showed no anal or rectal abnormalities. Impression:               - Diverticulosis in the entire examined colon.                            Colonic lipoma.                           - The distal rectum and anal verge are normal on                            retroflexion view.                           - No specimens collected. Moderate Sedation:  Moderate (conscious) sedation was administered by the endoscopy nurse       and supervised by the endoscopist. The following parameters were       monitored: oxygen saturation, heart rate, blood pressure, respiratory       rate, EKG, adequacy of pulmonary ventilation, and response to care.       Total physician intraservice time was 16 minutes. Recommendation:           - Patient has a contact number available for                            emergencies. The signs and symptoms of potential                            delayed complications were discussed with the                             patient. Return to normal activities tomorrow.                            Written discharge instructions were provided to the                            patient.                           - Resume previous diet.                           - Continue present medications.                           - Repeat colonoscopy in 5 years for surveillance.                           - Return to GI office PRN. Procedure Code(s):        --- Professional ---                           [phone], Colonoscopy, flexible; diagnostic, including                            collection of specimen(s) by brushing or washing,                            when performed (separate procedure)                           G0500, Moderate sedation services provided by the                            same physician or other qualified health care                            professional performing a gastrointestinal  endoscopic service that sedation supports,                            requiring the presence of an independent trained                            observer to assist in the monitoring of the                            patient's level of consciousness and physiological                            status; initial 15 minutes of intra-service time;                            patient age 57 years or older (additional time may                            be reported with [phone], as appropriate) Diagnosis Code(s):        --- Professional ---                           Z86.010, Personal history of colonic polyps                           K57.30, Diverticulosis of large intestine without                            perforation or abscess without bleeding CPT copyright 2017 [employer]. All rights reserved. The codes documented in this report are preliminary and upon coder review may  be revised to meet current compliance requirements. Cristopher Estimable. Kamerin Axford, MD Norvel Richards, MD 01/14/2018  9:42:55 AM This report has been signed electronically. Number of Addenda: 0

## 2018-01-14 NOTE — H&P (Signed)
@LOGO @   Primary Care Physician:  Sharilyn Sites, MD Primary Gastroenterologist:  Dr. Gala Romney  Pre-Procedure History & Physical: HPI:  Spencer Serrano is a 62 y.o. male here for a surveillance colonoscopy. History of multiple colonic adenomas.  Past Medical History:  Diagnosis Date  . Gout   . History of kidney stones   . Hypercholesteremia   . Hypertension     Past Surgical History:  Procedure Laterality Date  . CARDIAC CATHETERIZATION    . COLONOSCOPY    . COLONOSCOPY N/A 10/17/2014   Procedure: COLONOSCOPY;  Surgeon: Daneil Dolin, MD;  Location: [facility];  Service: Endoscopy;  Laterality: N/A;  830    Prior to Admission medications   Medication Sig Start Date End Date Taking? Authorizing Provider  allopurinol (ZYLOPRIM) 100 MG tablet Take 100 mg by mouth daily.    Yes [provider]  aspirin 81 MG tablet Take 81 mg by mouth daily.   Yes [provider]  lisinopril (PRINIVIL,ZESTRIL) 20 MG tablet Take 20 mg by mouth 2 (two) times daily. 07/26/14  Yes [provider]  simvastatin (ZOCOR) 80 MG tablet Take 40 mg by mouth daily.    Yes [provider]  polyethylene glycol-electrolytes (TRILYTE) 420 g solution Take 4,000 mLs by mouth as directed. 11/19/17   Annitta Needs, NP    Allergies as of 11/19/2017 - Review Complete 11/19/2017  Allergen Reaction Noted  . Penicillins Other (See Comments) 09/20/2014    History reviewed. No pertinent family history.  Social History   Socioeconomic History  . Marital status: Married    Spouse name: Not on file  . Number of children: Not on file  . Years of education: Not on file  . Highest education level: Not on file  Occupational History  . Not on file  Social Needs  . Financial resource strain: Not on file  . Food insecurity:    Worry: Not on file    Inability: Not on file  . Transportation needs:    Medical: Not on file    Non-medical: Not on file  Tobacco Use  . Smoking status:  Never Smoker  . Smokeless tobacco: Never Used  Substance and Sexual Activity  . Alcohol use: Yes    Alcohol/week: 0.0 oz    Comment: "Very Rarely"  . Drug use: No  . Sexual activity: Not on file  Lifestyle  . Physical activity:    Days per week: Not on file    Minutes per session: Not on file  . Stress: Not on file  Relationships  . Social connections:    Talks on phone: Not on file    Gets together: Not on file    Attends religious service: Not on file    Active member of club or organization: Not on file    Attends meetings of clubs or organizations: Not on file    Relationship status: Not on file  . Intimate partner violence:    Fear of current or ex partner: Not on file    Emotionally abused: Not on file    Physically abused: Not on file    Forced sexual activity: Not on file  Other Topics Concern  . Not on file  Social History Narrative  . Not on file    Review of Systems: See HPI, otherwise negative ROS  Physical Exam: BP (!) 158/84   Pulse (!) 56   Temp 99 F (37.2 C) (Oral)   Resp 14  Ht 6\' 2"  (1.88 m)   Wt 240 lb (108.9 kg)   SpO2 97%   BMI 30.81 kg/m  General:   Alert,  Well-developed, well-nourished, pleasant and cooperative in NAD Neck:  Supple; no masses or thyromegaly. No significant cervical adenopathy. Lungs:  Clear throughout to auscultation.   No wheezes, crackles, or rhonchi. No acute distress. Heart:  Regular rate and rhythm; no murmurs, clicks, rubs,  or gallops. Abdomen: Non-distended, normal bowel sounds.  Soft and nontender without appreciable mass or hepatosplenomegaly.  Pulses:  Normal pulses noted. Extremities:  Without clubbing or edema.  Impression/Plan:  :  62 year old gentleman with multiple  Colonic adenomas removed in 2016.   Here for surveillance examination. The risks, benefits, limitations, alternatives and imponderables have been reviewed with the patient. Questions have been answered. All parties are agreeable.      Notice: This dictation was prepared with Dragon dictation along with smaller phrase technology. Any transcriptional errors that result from this process are unintentional and may not be corrected upon review.

## 2018-01-19 ENCOUNTER — Encounter (HOSPITAL_COMMUNITY): Payer: Self-pay | Admitting: Internal Medicine

## 2018-02-23 DIAGNOSIS — Z1389 Encounter for screening for other disorder: Secondary | ICD-10-CM | POA: Diagnosis not present

## 2018-02-23 DIAGNOSIS — L989 Disorder of the skin and subcutaneous tissue, unspecified: Secondary | ICD-10-CM | POA: Diagnosis not present

## 2018-02-23 DIAGNOSIS — Z6830 Body mass index (BMI) 30.0-30.9, adult: Secondary | ICD-10-CM | POA: Diagnosis not present

## 2018-02-23 DIAGNOSIS — E6609 Other obesity due to excess calories: Secondary | ICD-10-CM | POA: Diagnosis not present

## 2018-09-18 DIAGNOSIS — Z0001 Encounter for general adult medical examination with abnormal findings: Secondary | ICD-10-CM | POA: Diagnosis not present

## 2018-09-18 DIAGNOSIS — Z6831 Body mass index (BMI) 31.0-31.9, adult: Secondary | ICD-10-CM | POA: Diagnosis not present

## 2018-09-18 DIAGNOSIS — E7849 Other hyperlipidemia: Secondary | ICD-10-CM | POA: Diagnosis not present

## 2018-09-18 DIAGNOSIS — R7309 Other abnormal glucose: Secondary | ICD-10-CM | POA: Diagnosis not present

## 2018-09-18 DIAGNOSIS — Z125 Encounter for screening for malignant neoplasm of prostate: Secondary | ICD-10-CM | POA: Diagnosis not present

## 2018-09-18 DIAGNOSIS — Z1389 Encounter for screening for other disorder: Secondary | ICD-10-CM | POA: Diagnosis not present

## 2018-09-18 DIAGNOSIS — I10 Essential (primary) hypertension: Secondary | ICD-10-CM | POA: Diagnosis not present

## 2018-10-06 DIAGNOSIS — L570 Actinic keratosis: Secondary | ICD-10-CM | POA: Diagnosis not present

## 2018-10-06 DIAGNOSIS — B353 Tinea pedis: Secondary | ICD-10-CM | POA: Diagnosis not present

## 2018-10-06 DIAGNOSIS — D485 Neoplasm of uncertain behavior of skin: Secondary | ICD-10-CM | POA: Diagnosis not present

## 2018-10-06 DIAGNOSIS — D225 Melanocytic nevi of trunk: Secondary | ICD-10-CM | POA: Diagnosis not present

## 2019-02-03 ENCOUNTER — Other Ambulatory Visit: Payer: Self-pay

## 2019-02-03 ENCOUNTER — Other Ambulatory Visit: Payer: BLUE CROSS/BLUE SHIELD

## 2019-02-03 DIAGNOSIS — R6889 Other general symptoms and signs: Secondary | ICD-10-CM | POA: Diagnosis not present

## 2019-02-03 DIAGNOSIS — Z20822 Contact with and (suspected) exposure to covid-19: Secondary | ICD-10-CM

## 2019-02-05 LAB — NOVEL CORONAVIRUS, NAA: SARS-CoV-2, NAA: DETECTED — AB

## 2019-09-20 DIAGNOSIS — Z1389 Encounter for screening for other disorder: Secondary | ICD-10-CM | POA: Diagnosis not present

## 2019-09-20 DIAGNOSIS — Z6831 Body mass index (BMI) 31.0-31.9, adult: Secondary | ICD-10-CM | POA: Diagnosis not present

## 2019-09-20 DIAGNOSIS — E7849 Other hyperlipidemia: Secondary | ICD-10-CM | POA: Diagnosis not present

## 2019-09-20 DIAGNOSIS — Z0001 Encounter for general adult medical examination with abnormal findings: Secondary | ICD-10-CM | POA: Diagnosis not present

## 2019-09-20 DIAGNOSIS — R7309 Other abnormal glucose: Secondary | ICD-10-CM | POA: Diagnosis not present

## 2019-09-20 DIAGNOSIS — I10 Essential (primary) hypertension: Secondary | ICD-10-CM | POA: Diagnosis not present

## 2019-09-20 DIAGNOSIS — R000 Tachycardia, unspecified: Secondary | ICD-10-CM | POA: Diagnosis not present

## 2019-10-06 DIAGNOSIS — D0462 Carcinoma in situ of skin of left upper limb, including shoulder: Secondary | ICD-10-CM | POA: Diagnosis not present

## 2019-10-06 DIAGNOSIS — Z85828 Personal history of other malignant neoplasm of skin: Secondary | ICD-10-CM | POA: Diagnosis not present

## 2019-10-06 DIAGNOSIS — L570 Actinic keratosis: Secondary | ICD-10-CM | POA: Diagnosis not present

## 2019-10-06 DIAGNOSIS — D485 Neoplasm of uncertain behavior of skin: Secondary | ICD-10-CM | POA: Diagnosis not present

## 2019-10-21 DIAGNOSIS — C44622 Squamous cell carcinoma of skin of right upper limb, including shoulder: Secondary | ICD-10-CM | POA: Diagnosis not present

## 2020-02-01 ENCOUNTER — Other Ambulatory Visit: Payer: Self-pay

## 2020-04-17 DIAGNOSIS — L570 Actinic keratosis: Secondary | ICD-10-CM | POA: Diagnosis not present

## 2020-04-17 DIAGNOSIS — D239 Other benign neoplasm of skin, unspecified: Secondary | ICD-10-CM | POA: Diagnosis not present

## 2020-04-17 DIAGNOSIS — Z85828 Personal history of other malignant neoplasm of skin: Secondary | ICD-10-CM | POA: Diagnosis not present

## 2020-05-16 DIAGNOSIS — E6609 Other obesity due to excess calories: Secondary | ICD-10-CM | POA: Diagnosis not present

## 2020-05-16 DIAGNOSIS — Z6831 Body mass index (BMI) 31.0-31.9, adult: Secondary | ICD-10-CM | POA: Diagnosis not present

## 2020-05-16 DIAGNOSIS — M109 Gout, unspecified: Secondary | ICD-10-CM | POA: Diagnosis not present

## 2020-09-20 DIAGNOSIS — I10 Essential (primary) hypertension: Secondary | ICD-10-CM | POA: Diagnosis not present

## 2020-09-20 DIAGNOSIS — Z6830 Body mass index (BMI) 30.0-30.9, adult: Secondary | ICD-10-CM | POA: Diagnosis not present

## 2020-09-20 DIAGNOSIS — Z1389 Encounter for screening for other disorder: Secondary | ICD-10-CM | POA: Diagnosis not present

## 2020-09-20 DIAGNOSIS — E7849 Other hyperlipidemia: Secondary | ICD-10-CM | POA: Diagnosis not present

## 2020-09-20 DIAGNOSIS — M1A00X0 Idiopathic chronic gout, unspecified site, without tophus (tophi): Secondary | ICD-10-CM | POA: Diagnosis not present

## 2020-09-20 DIAGNOSIS — Z0001 Encounter for general adult medical examination with abnormal findings: Secondary | ICD-10-CM | POA: Diagnosis not present

## 2020-09-20 DIAGNOSIS — R7309 Other abnormal glucose: Secondary | ICD-10-CM | POA: Diagnosis not present

## 2020-10-31 DIAGNOSIS — L570 Actinic keratosis: Secondary | ICD-10-CM | POA: Diagnosis not present

## 2021-04-30 DIAGNOSIS — L570 Actinic keratosis: Secondary | ICD-10-CM | POA: Diagnosis not present

## 2021-09-05 DIAGNOSIS — E663 Overweight: Secondary | ICD-10-CM | POA: Diagnosis not present

## 2021-09-05 DIAGNOSIS — Z1331 Encounter for screening for depression: Secondary | ICD-10-CM | POA: Diagnosis not present

## 2021-09-05 DIAGNOSIS — M1A00X0 Idiopathic chronic gout, unspecified site, without tophus (tophi): Secondary | ICD-10-CM | POA: Diagnosis not present

## 2021-09-05 DIAGNOSIS — R7309 Other abnormal glucose: Secondary | ICD-10-CM | POA: Diagnosis not present

## 2021-09-05 DIAGNOSIS — R972 Elevated prostate specific antigen [PSA]: Secondary | ICD-10-CM | POA: Diagnosis not present

## 2021-09-05 DIAGNOSIS — Z0001 Encounter for general adult medical examination with abnormal findings: Secondary | ICD-10-CM | POA: Diagnosis not present

## 2021-09-05 DIAGNOSIS — Z6830 Body mass index (BMI) 30.0-30.9, adult: Secondary | ICD-10-CM | POA: Diagnosis not present

## 2021-09-05 DIAGNOSIS — I10 Essential (primary) hypertension: Secondary | ICD-10-CM | POA: Diagnosis not present

## 2021-09-05 DIAGNOSIS — E7849 Other hyperlipidemia: Secondary | ICD-10-CM | POA: Diagnosis not present

## 2021-09-05 DIAGNOSIS — E782 Mixed hyperlipidemia: Secondary | ICD-10-CM | POA: Diagnosis not present

## 2021-09-06 DIAGNOSIS — Z1331 Encounter for screening for depression: Secondary | ICD-10-CM | POA: Diagnosis not present

## 2021-10-29 DIAGNOSIS — Z1283 Encounter for screening for malignant neoplasm of skin: Secondary | ICD-10-CM | POA: Diagnosis not present

## 2021-10-29 DIAGNOSIS — L570 Actinic keratosis: Secondary | ICD-10-CM | POA: Diagnosis not present

## 2021-10-29 DIAGNOSIS — Z85828 Personal history of other malignant neoplasm of skin: Secondary | ICD-10-CM | POA: Diagnosis not present

## 2021-11-13 DIAGNOSIS — E6609 Other obesity due to excess calories: Secondary | ICD-10-CM | POA: Diagnosis not present

## 2021-11-13 DIAGNOSIS — Z6831 Body mass index (BMI) 31.0-31.9, adult: Secondary | ICD-10-CM | POA: Diagnosis not present

## 2021-11-13 DIAGNOSIS — J302 Other seasonal allergic rhinitis: Secondary | ICD-10-CM | POA: Diagnosis not present

## 2022-04-30 DIAGNOSIS — D239 Other benign neoplasm of skin, unspecified: Secondary | ICD-10-CM | POA: Diagnosis not present

## 2022-04-30 DIAGNOSIS — Z1283 Encounter for screening for malignant neoplasm of skin: Secondary | ICD-10-CM | POA: Diagnosis not present

## 2022-04-30 DIAGNOSIS — Z85828 Personal history of other malignant neoplasm of skin: Secondary | ICD-10-CM | POA: Diagnosis not present

## 2022-04-30 DIAGNOSIS — L570 Actinic keratosis: Secondary | ICD-10-CM | POA: Diagnosis not present

## 2022-09-09 DIAGNOSIS — R972 Elevated prostate specific antigen [PSA]: Secondary | ICD-10-CM | POA: Diagnosis not present

## 2022-09-09 DIAGNOSIS — E7849 Other hyperlipidemia: Secondary | ICD-10-CM | POA: Diagnosis not present

## 2022-09-09 DIAGNOSIS — Z1331 Encounter for screening for depression: Secondary | ICD-10-CM | POA: Diagnosis not present

## 2022-09-09 DIAGNOSIS — I10 Essential (primary) hypertension: Secondary | ICD-10-CM | POA: Diagnosis not present

## 2022-09-09 DIAGNOSIS — R7309 Other abnormal glucose: Secondary | ICD-10-CM | POA: Diagnosis not present

## 2022-09-09 DIAGNOSIS — Z6831 Body mass index (BMI) 31.0-31.9, adult: Secondary | ICD-10-CM | POA: Diagnosis not present

## 2022-09-09 DIAGNOSIS — E782 Mixed hyperlipidemia: Secondary | ICD-10-CM | POA: Diagnosis not present

## 2022-09-09 DIAGNOSIS — Z0001 Encounter for general adult medical examination with abnormal findings: Secondary | ICD-10-CM | POA: Diagnosis not present

## 2022-09-09 DIAGNOSIS — E6609 Other obesity due to excess calories: Secondary | ICD-10-CM | POA: Diagnosis not present

## 2022-11-05 DIAGNOSIS — D485 Neoplasm of uncertain behavior of skin: Secondary | ICD-10-CM | POA: Diagnosis not present

## 2022-11-05 DIAGNOSIS — L570 Actinic keratosis: Secondary | ICD-10-CM | POA: Diagnosis not present

## 2022-11-05 DIAGNOSIS — H61002 Unspecified perichondritis of left external ear: Secondary | ICD-10-CM | POA: Diagnosis not present

## 2022-11-18 ENCOUNTER — Other Ambulatory Visit: Payer: Self-pay

## 2022-11-18 ENCOUNTER — Emergency Department (HOSPITAL_COMMUNITY)
Admission: EM | Admit: 2022-11-18 | Discharge: 2022-11-18 | Disposition: A | Discharge status: Home / Self Care | Payer: PPO | Attending: Emergency Medicine | Admitting: Emergency Medicine

## 2022-11-18 ENCOUNTER — Encounter (HOSPITAL_COMMUNITY): Payer: Self-pay

## 2022-11-18 ENCOUNTER — Emergency Department (HOSPITAL_COMMUNITY): Payer: PPO

## 2022-11-18 DIAGNOSIS — N200 Calculus of kidney: Secondary | ICD-10-CM | POA: Diagnosis not present

## 2022-11-18 DIAGNOSIS — Z79899 Other long term (current) drug therapy: Secondary | ICD-10-CM | POA: Insufficient documentation

## 2022-11-18 DIAGNOSIS — Z7982 Long term (current) use of aspirin: Secondary | ICD-10-CM | POA: Insufficient documentation

## 2022-11-18 DIAGNOSIS — I10 Essential (primary) hypertension: Secondary | ICD-10-CM | POA: Insufficient documentation

## 2022-11-18 DIAGNOSIS — R109 Unspecified abdominal pain: Secondary | ICD-10-CM | POA: Diagnosis present

## 2022-11-18 DIAGNOSIS — K5730 Diverticulosis of large intestine without perforation or abscess without bleeding: Secondary | ICD-10-CM | POA: Diagnosis not present

## 2022-11-18 LAB — CBC WITH DIFFERENTIAL/PLATELET
Abs Immature Granulocytes: 0.06 10*3/uL (ref 0.00–0.07)
Basophils Absolute: 0.1 10*3/uL (ref 0.0–0.1)
Basophils Relative: 1 %
Eosinophils Absolute: 0.3 10*3/uL (ref 0.0–0.5)
Eosinophils Relative: 3 %
HCT: 41.3 % (ref 39.0–52.0)
Hemoglobin: 13.4 g/dL (ref 13.0–17.0)
Immature Granulocytes: 1 %
Lymphocytes Relative: 11 %
Lymphs Abs: 1.2 10*3/uL (ref 0.7–4.0)
MCH: 29.4 pg (ref 26.0–34.0)
MCHC: 32.4 g/dL (ref 30.0–36.0)
MCV: 90.6 fL (ref 80.0–100.0)
Monocytes Absolute: 0.5 10*3/uL (ref 0.1–1.0)
Monocytes Relative: 5 %
Neutro Abs: 8.6 10*3/uL — ABNORMAL HIGH (ref 1.7–7.7)
Neutrophils Relative %: 79 %
Platelets: 314 10*3/uL (ref 150–400)
RBC: 4.56 MIL/uL (ref 4.22–5.81)
RDW: 12.6 % (ref 11.5–15.5)
WBC: 10.6 10*3/uL — ABNORMAL HIGH (ref 4.0–10.5)
nRBC: 0 % (ref 0.0–0.2)

## 2022-11-18 LAB — COMPREHENSIVE METABOLIC PANEL
ALT: 13 U/L (ref 0–44)
AST: 17 U/L (ref 15–41)
Albumin: 4.1 g/dL (ref 3.5–5.0)
Alkaline Phosphatase: 57 U/L (ref 38–126)
Anion gap: 8 (ref 5–15)
BUN: 20 mg/dL (ref 8–23)
CO2: 23 mmol/L (ref 22–32)
Calcium: 8.9 mg/dL (ref 8.9–10.3)
Chloride: 105 mmol/L (ref 98–111)
Creatinine, Ser: 1.44 mg/dL — ABNORMAL HIGH (ref 0.61–1.24)
GFR, Estimated: 53 mL/min — ABNORMAL LOW (ref >60)
Glucose, Bld: 122 mg/dL — ABNORMAL HIGH (ref 70–99)
Potassium: 4.3 mmol/L (ref 3.5–5.1)
Sodium: 136 mmol/L (ref 135–145)
Total Bilirubin: 0.6 mg/dL (ref 0.3–1.2)
Total Protein: 7.3 g/dL (ref 6.5–8.1)

## 2022-11-18 LAB — URINALYSIS, ROUTINE W REFLEX MICROSCOPIC
Bilirubin Urine: NEGATIVE
Glucose, UA: NEGATIVE mg/dL
Hgb urine dipstick: NEGATIVE
Ketones, ur: NEGATIVE mg/dL
Leukocytes,Ua: NEGATIVE
Nitrite: NEGATIVE
Protein, ur: NEGATIVE mg/dL
Specific Gravity, Urine: 1.021 (ref 1.005–1.030)
pH: 5 (ref 5.0–8.0)

## 2022-11-18 LAB — CBG MONITORING, ED: Glucose-Capillary: 129 mg/dL — ABNORMAL HIGH (ref 70–99)

## 2022-11-18 LAB — LIPASE, BLOOD: Lipase: 27 U/L (ref 11–51)

## 2022-11-18 MED ORDER — MORPHINE SULFATE (PF) 4 MG/ML IV SOLN
4.0000 mg | Freq: Once | INTRAVENOUS | Status: AC
  Administered 2022-11-18: 4 mg via INTRAVENOUS
  Filled 2022-11-18: qty 1

## 2022-11-18 MED ORDER — ONDANSETRON 4 MG PO TBDP: 4.0000 mg | ORAL_TABLET | Freq: Three times a day (TID) | ORAL | 0 refills | Status: DC | PRN

## 2022-11-18 MED ORDER — SODIUM CHLORIDE 0.9 % IV BOLUS
500.0000 mL | Freq: Once | INTRAVENOUS | Status: AC
  Administered 2022-11-18: 500 mL via INTRAVENOUS

## 2022-11-18 MED ORDER — KETOROLAC TROMETHAMINE 30 MG/ML IJ SOLN
30.0000 mg | Freq: Once | INTRAMUSCULAR | Status: AC
  Administered 2022-11-18: 30 mg via INTRAVENOUS
  Filled 2022-11-18: qty 1

## 2022-11-18 MED ORDER — OXYCODONE-ACETAMINOPHEN 5-325 MG PO TABS: 1.0000 | ORAL_TABLET | Freq: Four times a day (QID) | ORAL | 0 refills | Status: DC | PRN

## 2022-11-18 MED ORDER — ONDANSETRON HCL 4 MG/2ML IJ SOLN
4.0000 mg | Freq: Once | INTRAMUSCULAR | Status: AC
  Administered 2022-11-18: 4 mg via INTRAVENOUS
  Filled 2022-11-18: qty 2

## 2022-11-18 MED ORDER — TAMSULOSIN HCL 0.4 MG PO CAPS: 0.4000 mg | ORAL_CAPSULE | Freq: Every day | ORAL | 0 refills | Status: DC

## 2022-11-18 MED ORDER — HYDROMORPHONE HCL 1 MG/ML IJ SOLN
1.0000 mg | Freq: Once | INTRAMUSCULAR | Status: AC
  Administered 2022-11-18: 1 mg via INTRAVENOUS
  Filled 2022-11-18: qty 1

## 2022-11-18 NOTE — ED Notes (Signed)
Pt given a sprite at this time. 

## 2022-11-18 NOTE — ED Triage Notes (Addendum)
Pt comes in with complaints of LLQ abdominal pain starting this morning radiating from lower rib to lt flank area. Pt states hx of kidney stones "this one feels different." Pt states recently occurring on 11/06/22 and realizing he didn't have a BM was able to relieve symptoms with Magnesium citrate, but last night had an episode of loose stool and waking up with this abdominal pain.

## 2022-11-18 NOTE — ED Notes (Signed)
Pt states nausea has resolved

## 2022-11-18 NOTE — ED Provider Notes (Signed)
[place] EMERGENCY DEPARTMENT AT [facility] Provider Note   CSN: [id number] Arrival date & time: 11/18/22  4098     History  Chief Complaint  Patient presents with   Abdominal Pain    Spencer Serrano is a 67 y.o. male.  Pt is a 67 yo male with pmhx significant for htn, HLD, gout, and kidney stones.  Pt said he's been having left flank pain for about a week.  He thought it was constipation and he took mg ci which relieved constipation and pain.  He thought he was ok, but woke up this am with worsening pain around 0400.  He's had kidney stones in the past, but this does not feel the same.       Home Medications Prior to Admission medications   Medication Sig Start Date End Date Taking? Authorizing Provider  allopurinol (ZYLOPRIM) 100 MG tablet Take 50 mg by mouth daily.   Yes [provider]  aspirin 81 MG tablet Take 81 mg by mouth daily.   Yes [provider]  lisinopril (PRINIVIL,ZESTRIL) 20 MG tablet Take 20 mg by mouth 2 (two) times daily. 07/26/14  Yes [provider]  ondansetron (ZOFRAN-ODT) 4 MG disintegrating tablet Take 1 tablet (4 mg total) by mouth every 8 (eight) hours as needed. 11/18/22  Yes Jacalyn Lefevre, MD  oxyCODONE-acetaminophen (PERCOCET/ROXICET) 5-325 MG tablet Take 1 tablet by mouth every 6 (six) hours as needed for severe pain. 11/18/22  Yes Jacalyn Lefevre, MD  rosuvastatin (CRESTOR) 20 MG tablet Take 20 mg by mouth daily. 09/09/22  Yes [provider]  tadalafil (CIALIS) 5 MG tablet Take 5-20 mg by mouth daily as needed for erectile dysfunction. 09/04/22  Yes [provider]  tamsulosin (FLOMAX) 0.4 MG CAPS capsule Take 1 capsule (0.4 mg total) by mouth daily. 11/18/22  Yes Jacalyn Lefevre, MD      Allergies    Penicillins    Review of Systems   Review of Systems  Gastrointestinal:  Positive for abdominal pain.  All other systems reviewed and are negative.   Physical Exam Updated Vital  Signs BP 135/77   Pulse (!) 58   Temp 98.2 F (36.8 C) (Oral)   Resp 12   Ht 6\' 1"  (1.854 m)   Wt 108.9 kg   SpO2 98%   BMI 31.66 kg/m  Physical Exam Vitals and nursing note reviewed.  Constitutional:      Appearance: He is well-developed. He is obese.  HENT:     Head: Normocephalic and atraumatic.     Mouth/Throat:     Mouth: Mucous membranes are moist.     Pharynx: Oropharynx is clear.  Eyes:     Extraocular Movements: Extraocular movements intact.     Pupils: Pupils are equal, round, and reactive to light.  Cardiovascular:     Rate and Rhythm: Normal rate and regular rhythm.     Heart sounds: Normal heart sounds.  Pulmonary:     Breath sounds: Normal breath sounds.  Abdominal:     General: Abdomen is flat and protuberant.     Palpations: Abdomen is soft.     Tenderness: There is abdominal tenderness in the left upper quadrant.  Skin:    General: Skin is warm.     Capillary Refill: Capillary refill takes less than 2 seconds.  Neurological:     General: No focal deficit present.     Mental Status: He is alert and oriented to person, place, and time.  Psychiatric:        Mood and Affect: Mood normal.        Behavior: Behavior normal.     ED Results / Procedures / Treatments   Labs (all labs ordered are listed, but only abnormal results are displayed) Labs Reviewed  CBC WITH DIFFERENTIAL/PLATELET - Abnormal; Notable for the following components:      Result Value   WBC 10.6 (*)    Neutro Abs 8.6 (*)    All other components within normal limits  COMPREHENSIVE METABOLIC PANEL - Abnormal; Notable for the following components:   Glucose, Bld 122 (*)    Creatinine, Ser 1.44 (*)    GFR, Estimated 53 (*)    All other components within normal limits  CBG MONITORING, ED - Abnormal; Notable for the following components:   Glucose-Capillary 129 (*)    All other components within normal limits  LIPASE, BLOOD  URINALYSIS, ROUTINE W REFLEX MICROSCOPIC     EKG None  Radiology CT Renal Stone Study  Result Date: 11/18/2022 CLINICAL DATA:  Abdominal pain.  Flank pain EXAM: CT ABDOMEN AND PELVIS WITHOUT CONTRAST TECHNIQUE: Multidetector CT imaging of the abdomen and pelvis was performed following the standard protocol without IV contrast. RADIATION DOSE REDUCTION: This exam was performed according to the departmental dose-optimization program which includes automated exposure control, adjustment of the mA and/or kV according to patient size and/or use of iterative reconstruction technique. COMPARISON:  CT 2010 November FINDINGS: Lower chest: There is some linear opacity lung bases likely scar or atelectasis. No pleural effusion. Coronary artery calcifications are seen. Hepatobiliary: No focal liver abnormality is seen. No gallstones, gallbladder wall thickening, or biliary dilatation. Pancreas: Unremarkable. No pancreatic ductal dilatation or surrounding inflammatory changes. Spleen: Normal in size without focal abnormality. Adrenals/Urinary Tract: The adrenal glands are preserved. 3 mm nonobstructing upper pole right-sided renal stone. There is a exophytic low-attenuation lesion from the lower pole of the right kidney measuring 4.2 cm in diameter and has Hounsfield units of 15, likely a cyst. No right ureteral stone. Relatively contracted urinary bladder. Left kidney is slightly enlarged compared to right, there is moderate perinephric stranding as well. There is a stone in the very proximal left ureter just distal to the ureteropelvic junction. On coronal image 73 of series 5 stone measures 7 mm. No additional ureteral stones. There are additional small lower pole stones measuring up to 4 mm. Stomach/Bowel: On this non oral contrast exam, the large bowel has a normal course and caliber. Few scattered colonic diverticula identified, particularly of the sigmoid colon. Large bowel is nondilated. Normal appendix. Stomach and small bowel are nondilated on this  non oral contrast exam. There are some small bowel stool appearance in the right lower quadrant, nonspecific. Vascular/Lymphatic: Normal caliber aorta and IVC with some scattered vascular calcifications. No abnormal lymph node enlargement identified in the abdomen and pelvis. Reproductive: Prostate is unremarkable. Other: Small fat containing inguinal hernias. No free air or free fluid. Musculoskeletal: Scattered degenerative changes of the spine and pelvis. Multilevel Schmorl's node deformities along the spine IMPRESSION: 7 mm vertically measured stone in the proximal left ureter with mild collecting system dilatation. Additional bilateral nonobstructing stones. Colonic diverticula.  Normal appendix. Exophytic cyst from the lower pole of the right kidney, likely a simple cyst, Bosniak 2. No specific imaging follow-up Electronically Signed   By: Karen Kays M.D.   On: 11/18/2022 09:47    Procedures Procedures    Medications Ordered in ED Medications  morphine (PF)  4 MG/ML injection 4 mg (4 mg Intravenous Given 11/18/22 0822)  ondansetron (ZOFRAN) injection 4 mg (4 mg Intravenous Given 11/18/22 0822)  sodium chloride 0.9 % bolus 500 mL (0 mLs Intravenous Stopped 11/18/22 1257)  HYDROmorphone (DILAUDID) injection 1 mg (1 mg Intravenous Given 11/18/22 1009)  ketorolac (TORADOL) 30 MG/ML injection 30 mg (30 mg Intravenous Given 11/18/22 1014)  sodium chloride 0.9 % bolus 500 mL (0 mLs Intravenous Stopped 11/18/22 1257)  ondansetron (ZOFRAN) injection 4 mg (4 mg Intravenous Given 11/18/22 1225)    ED Course/ Medical Decision Making/ A&P                             Medical Decision Making Amount and/or Complexity of Data Reviewed Labs: ordered. Radiology: ordered.  Risk Prescription drug management.   This patient presents to the ED for concern of abd pain, this involves an extensive number of treatment options, and is a complaint that carries with it a high risk of complications and morbidity.   The differential diagnosis includes kidney stone, diverticulitis, pyelo, constipation   Co morbidities that complicate the patient evaluation   htn, HLD, gout, and kidney stones   Additional history obtained:  Additional history obtained from epic chart review External records from outside source obtained and reviewed including wife   Lab Tests:  I Ordered, and personally interpreted labs.  The pertinent results include:  cbc nl, cmp with cr 1.44 (no recent labs), lip nl, ua nl   Imaging Studies ordered:  I ordered imaging studies including ct renal  I independently visualized and interpreted imaging which showed  7 mm vertically measured stone in the proximal left ureter with mild  collecting system dilatation.    Additional bilateral nonobstructing stones.    Colonic diverticula.  Normal appendix.    Exophytic cyst from the lower pole of the right kidney, likely a  simple cyst, Bosniak 2. No specific imaging follow-up   I agree with the radiologist interpretation   Cardiac Monitoring:  The patient was maintained on a cardiac monitor.  I personally viewed and interpreted the cardiac monitored which showed an underlying rhythm of: nsr   Medicines ordered and prescription drug management:  I ordered medication including morphine/zofran  for sx  Reevaluation of the patient after these medicines showed that the patient improved I have reviewed the patients home medicines and have made adjustments as needed   Test Considered:  ct   Critical Interventions:  Pain control  Problem List / ED Course:  Kidney stone:  pt is feeling much better.  He is now able to eat/drink.  He is stable for d/c.  Return if worse.  F/u with urology.   Reevaluation:  After the interventions noted above, I reevaluated the patient and found that they have :improved   Social Determinants of Health:  Lives at home with wife   Dispostion:  After consideration of the diagnostic  results and the patients response to treatment, I feel that the patent would benefit from discharge with outpatient f/u.          Final Clinical Impression(s) / ED Diagnoses Final diagnoses:  Kidney stone    Rx / DC Orders ED Discharge Orders          Ordered    oxyCODONE-acetaminophen (PERCOCET/ROXICET) 5-325 MG tablet  Every 6 hours PRN        11/18/22 1340    ondansetron (ZOFRAN-ODT) 4 MG disintegrating tablet  Every 8 hours PRN        11/18/22 1340    tamsulosin (FLOMAX) 0.4 MG CAPS capsule  Daily        11/18/22 1340              Jacalyn Lefevre, MD 11/18/22 1341

## 2022-11-18 NOTE — ED Notes (Signed)
Pt states dizziness feels like it came after his pain medication but did not notify this RN. Pt states feeling "flushed and not dizzy but if he stood up he feels like he would fall." EKG and CBG performed at this time. MD notified.

## 2022-11-18 NOTE — ED Notes (Signed)
Pt became nauseous and throwing up. MD notified.

## 2022-11-26 DIAGNOSIS — E6609 Other obesity due to excess calories: Secondary | ICD-10-CM | POA: Diagnosis not present

## 2022-11-26 DIAGNOSIS — N200 Calculus of kidney: Secondary | ICD-10-CM | POA: Diagnosis not present

## 2022-11-26 DIAGNOSIS — Z6830 Body mass index (BMI) 30.0-30.9, adult: Secondary | ICD-10-CM | POA: Diagnosis not present

## 2022-11-26 DIAGNOSIS — K5900 Constipation, unspecified: Secondary | ICD-10-CM | POA: Diagnosis not present

## 2022-11-26 DIAGNOSIS — R109 Unspecified abdominal pain: Secondary | ICD-10-CM | POA: Diagnosis not present

## 2022-11-29 ENCOUNTER — Ambulatory Visit (HOSPITAL_COMMUNITY)
Admission: RE | Admit: 2022-11-29 | Discharge: 2022-11-29 | Disposition: A | Discharge status: Home / Self Care | Payer: PPO | Source: Ambulatory Visit | Attending: Urology | Admitting: Urology

## 2022-11-29 ENCOUNTER — Encounter (HOSPITAL_COMMUNITY): Payer: Self-pay

## 2022-11-29 ENCOUNTER — Other Ambulatory Visit: Payer: Self-pay

## 2022-11-29 ENCOUNTER — Encounter (HOSPITAL_COMMUNITY)
Admission: RE | Admit: 2022-11-29 | Discharge: 2022-11-29 | Disposition: A | Discharge status: Home / Self Care | Payer: PPO | Source: Ambulatory Visit | Attending: Urology | Admitting: Urology

## 2022-11-29 ENCOUNTER — Ambulatory Visit (INDEPENDENT_AMBULATORY_CARE_PROVIDER_SITE_OTHER): Payer: PPO | Admitting: Urology

## 2022-11-29 VITALS — BP 145/72 | HR 68

## 2022-11-29 DIAGNOSIS — N200 Calculus of kidney: Secondary | ICD-10-CM

## 2022-11-29 DIAGNOSIS — N201 Calculus of ureter: Secondary | ICD-10-CM

## 2022-11-29 LAB — URINALYSIS, ROUTINE W REFLEX MICROSCOPIC
Bilirubin, UA: NEGATIVE
Glucose, UA: NEGATIVE
Ketones, UA: NEGATIVE
Leukocytes,UA: NEGATIVE
Nitrite, UA: NEGATIVE
Protein,UA: NEGATIVE
RBC, UA: NEGATIVE
Specific Gravity, UA: 1.025 (ref 1.005–1.030)
Urobilinogen, Ur: 1 mg/dL (ref 0.2–1.0)
pH, UA: 6 (ref 5.0–7.5)

## 2022-11-29 MED ORDER — ONDANSETRON 4 MG PO TBDP: 4.0000 mg | ORAL_TABLET | Freq: Three times a day (TID) | ORAL | 0 refills | Status: DC | PRN

## 2022-11-29 MED ORDER — OXYCODONE-ACETAMINOPHEN 5-325 MG PO TABS: 1.0000 | ORAL_TABLET | Freq: Four times a day (QID) | ORAL | 0 refills | Status: DC | PRN

## 2022-11-29 MED ORDER — TAMSULOSIN HCL 0.4 MG PO CAPS: 0.4000 mg | ORAL_CAPSULE | Freq: Every day | ORAL | 1 refills | Status: DC

## 2022-11-29 NOTE — Progress Notes (Unsigned)
 11/29/2022 11:23 AM   Spencer Serrano 12/02/1955 [id number]  Referring provider: Golding, John, MD [address] ,  [place] [address]  Chief Complaint  Patient presents with   Nephrolithiasis    Left     HPI: Spencer Serrano is a 67yo here for followup for nephrolithiasis. He presented to ER on 5/13 and was diagnosed with a 7mm left proximal ureteral calculus. He has had 6 prior stone events. No prior surgery. He has very mild left flank pain currently. KUB from today shows a 7mm left proximal ureteral calculus   PMH: Past Medical History:  Diagnosis Date   Gout    History of kidney stones    Hypercholesteremia    Hypertension     Surgical History: Past Surgical History:  Procedure Laterality Date   CARDIAC CATHETERIZATION     COLONOSCOPY     COLONOSCOPY N/A 10/17/2014   Procedure: COLONOSCOPY;  Surgeon: Robert M Rourk, MD;  Location: [facility];  Service: Endoscopy;  Laterality: N/A;  830   COLONOSCOPY N/A 01/14/2018   Procedure: COLONOSCOPY;  Surgeon: Rourk, Robert M, MD;  Location: [facility];  Service: Endoscopy;  Laterality: N/A;  9:00    Home Medications:  Allergies as of 11/29/2022       Reactions   Penicillins Other (See Comments)   Passed out after getting penicillin shot Has patient had a PCN reaction causing immediate rash, facial/tongue/throat swelling, SOB or lightheadedness with hypotension: Yes Has patient had a PCN reaction causing severe rash involving mucus membranes or skin necrosis: No Has patient had a PCN reaction that required hospitalization: Yes - [facility] office Has patient had a PCN reaction occurring within the last 10 years: No If all of the above answers are "NO", then may proceed with Cephalosporin use.        Medication List        Accurate as of Nov 29, 2022 11:23 AM. If you have any questions, ask your nurse or doctor.          allopurinol 100 MG tablet Commonly known as: ZYLOPRIM Take 50 mg by mouth daily.    aspirin 81 MG tablet Take 81 mg by mouth daily.   lisinopril 20 MG tablet Commonly known as: ZESTRIL Take 20 mg by mouth 2 (two) times daily.   ondansetron 4 MG disintegrating tablet Commonly known as: ZOFRAN-ODT Take 1 tablet (4 mg total) by mouth every 8 (eight) hours as needed.   oxyCODONE-acetaminophen 5-325 MG tablet Commonly known as: PERCOCET/ROXICET Take 1 tablet by mouth every 6 (six) hours as needed for severe pain.   rosuvastatin 20 MG tablet Commonly known as: CRESTOR Take 20 mg by mouth daily.   tadalafil 5 MG tablet Commonly known as: CIALIS Take 5-20 mg by mouth daily as needed for erectile dysfunction.   tamsulosin 0.4 MG Caps capsule Commonly known as: FLOMAX Take 1 capsule (0.4 mg total) by mouth daily.   traMADol-acetaminophen 37.5-325 MG tablet Commonly known as: ULTRACET Take 1 tablet by mouth every 4 (four) hours as needed.        Allergies:  Allergies  Allergen Reactions   Penicillins Other (See Comments)    Passed out after getting penicillin shot Has patient had a PCN reaction causing immediate rash, facial/tongue/throat swelling, SOB or lightheadedness with hypotension: Yes Has patient had a PCN reaction causing severe rash involving mucus membranes or skin necrosis: No Has patient had a PCN reaction that required hospitalization: Yes - [facility] office Has patient   had a PCN reaction occurring within the last 10 years: No If all of the above answers are "NO", then may proceed with Cephalosporin use.    Family History: No family history on file.  Social History:  reports that he has never smoked. He has never used smokeless tobacco. He reports current alcohol use. He reports that he does not use drugs.  ROS: All other review of systems were reviewed and are negative except what is noted above in HPI  Physical Exam: BP (!) 145/72   Pulse 68   Constitutional:  Alert and oriented, No acute distress. HEENT: [place] AT, moist mucus membranes.   Trachea midline, no masses. Cardiovascular: No clubbing, cyanosis, or edema. Respiratory: Normal respiratory effort, no increased work of breathing. GI: Abdomen is soft, nontender, nondistended, no abdominal masses GU: No CVA tenderness.  Lymph: No cervical or inguinal lymphadenopathy. Skin: No rashes, bruises or suspicious lesions. Neurologic: Grossly intact, no focal deficits, moving all 4 extremities. Psychiatric: Normal mood and affect.  Laboratory Data: Lab Results  Component Value Date   WBC 10.6 (H) 11/18/2022   HGB 13.4 11/18/2022   HCT 41.3 11/18/2022   MCV 90.6 11/18/2022   PLT 314 11/18/2022    Lab Results  Component Value Date   CREATININE 1.44 (H) 11/18/2022    No results found for: "PSA"  No results found for: "TESTOSTERONE"  No results found for: "HGBA1C"  Urinalysis    Component Value Date/Time   COLORURINE YELLOW 11/18/2022 1122   APPEARANCEUR CLEAR 11/18/2022 1122   LABSPEC 1.021 11/18/2022 1122   PHURINE 5.0 11/18/2022 1122   GLUCOSEU NEGATIVE 11/18/2022 1122   HGBUR NEGATIVE 11/18/2022 1122   BILIRUBINUR NEGATIVE 11/18/2022 1122   KETONESUR NEGATIVE 11/18/2022 1122   PROTEINUR NEGATIVE 11/18/2022 1122   UROBILINOGEN 0.2 05/13/2009 2030   NITRITE NEGATIVE 11/18/2022 1122   LEUKOCYTESUR NEGATIVE 11/18/2022 1122    Lab Results  Component Value Date   BACTERIA RARE 05/13/2009    Pertinent Imaging: CT 11/18/2022: Images reviewed and discussed with the patient No results found for this or any previous visit.  No results found for this or any previous visit.  No results found for this or any previous visit.  No results found for this or any previous visit.  No results found for this or any previous visit.  No valid procedures specified. No results found for this or any previous visit.  Results for orders placed during the hospital encounter of 11/18/22  CT Renal Stone Study  Narrative CLINICAL DATA:  Abdominal pain.  Flank  pain  EXAM: CT ABDOMEN AND PELVIS WITHOUT CONTRAST  TECHNIQUE: Multidetector CT imaging of the abdomen and pelvis was performed following the standard protocol without IV contrast.  RADIATION DOSE REDUCTION: This exam was performed according to the departmental dose-optimization program which includes automated exposure control, adjustment of the mA and/or kV according to patient size and/or use of iterative reconstruction technique.  COMPARISON:  CT 2010 November  FINDINGS: Lower chest: There is some linear opacity lung bases likely scar or atelectasis. No pleural effusion. Coronary artery calcifications are seen.  Hepatobiliary: No focal liver abnormality is seen. No gallstones, gallbladder wall thickening, or biliary dilatation.  Pancreas: Unremarkable. No pancreatic ductal dilatation or surrounding inflammatory changes.  Spleen: Normal in size without focal abnormality.  Adrenals/Urinary Tract: The adrenal glands are preserved. 3 mm nonobstructing upper pole right-sided renal stone. There is a exophytic low-attenuation lesion from the lower pole of the right kidney measuring 4.2 cm in   diameter and has Hounsfield units of 15, likely a cyst. No right ureteral stone. Relatively contracted urinary bladder.  Left kidney is slightly enlarged compared to right, there is moderate perinephric stranding as well. There is a stone in the very proximal left ureter just distal to the ureteropelvic junction. On coronal image 73 of series 5 stone measures 7 mm. No additional ureteral stones. There are additional small lower pole stones measuring up to 4 mm.  Stomach/Bowel: On this non oral contrast exam, the large bowel has a normal course and caliber. Few scattered colonic diverticula identified, particularly of the sigmoid colon. Large bowel is nondilated. Normal appendix. Stomach and small bowel are nondilated on this non oral contrast exam. There are some small bowel  stool appearance in the right lower quadrant, nonspecific.  Vascular/Lymphatic: Normal caliber aorta and IVC with some scattered vascular calcifications. No abnormal lymph node enlargement identified in the abdomen and pelvis.  Reproductive: Prostate is unremarkable.  Other: Small fat containing inguinal hernias. No free air or free fluid.  Musculoskeletal: Scattered degenerative changes of the spine and pelvis. Multilevel Schmorl's node deformities along the spine  IMPRESSION: 7 mm vertically measured stone in the proximal left ureter with mild collecting system dilatation.  Additional bilateral nonobstructing stones.  Colonic diverticula.  Normal appendix.  Exophytic cyst from the lower pole of the right kidney, likely a simple cyst, Bosniak 2. No specific imaging follow-up   Electronically Signed By: Ashok  Gupta M.D. On: 11/18/2022 09:47   Assessment & Plan:    1. Kidney stones -We discussed the management of kidney stones. These options include observation, ureteroscopy, shockwave lithotripsy (ESWL) and percutaneous nephrolithotomy (PCNL). We discussed which options are relevant to the patient's stone(s). We discussed the natural history of kidney stones as well as the complications of untreated stones and the impact on quality of life without treatment as well as with each of the above listed treatments. We also discussed the efficacy of each treatment in its ability to clear the stone burden. With any of these management options I discussed the signs and symptoms of infection and the need for emergent treatment should these be experienced. For each option we discussed the ability of each procedure to clear the patient of their stone burden.   For observation I described the risks which include but are not limited to silent renal damage, life-threatening infection, need for emergent surgery, failure to pass stone and pain.   For ureteroscopy I described the risks which  include bleeding, infection, damage to contiguous structures, positioning injury, ureteral stricture, ureteral avulsion, ureteral injury, need for prolonged ureteral stent, inability to perform ureteroscopy, need for an interval procedure, inability to clear stone burden, stent discomfort/pain, heart attack, stroke, pulmonary embolus and the inherent risks with general anesthesia.   For shockwave lithotripsy I described the risks which include arrhythmia, kidney contusion, kidney hemorrhage, need for transfusion, pain, inability to adequately break up stone, inability to pass stone fragments, Steinstrasse, infection associated with obstructing stones, need for alternate surgical procedure, need for repeat shockwave lithotripsy, MI, CVA, PE and the inherent risks with anesthesia/conscious sedation.   For PCNL I described the risks including positioning injury, pneumothorax, hydrothorax, need for chest tube, inability to clear stone burden, renal laceration, arterial venous fistula or malformation, need for embolization of kidney, loss of kidney or renal function, need for repeat procedure, need for prolonged nephrostomy tube, ureteral avulsion, MI, CVA, PE and the inherent risks of general anesthesia.   - The patient would   like to proceed with left ESWL - Urinalysis, Routine w reflex microscopic   No follow-ups on file.  Chastin Garlitz, MD  [facility] [place]   

## 2022-11-29 NOTE — Patient Instructions (Signed)
ESWL for Kidney Stones  Extracorporeal shock wave lithotripsy (ESWL) is a treatment that can help break up kidney stones that are too large to pass on their own.  This is a nonsurgical procedure that breaks up a kidney stone with shock waves. These shock waves pass through your body and focus on the kidney stone. They cause the kidney stone to break into smaller pieces (fragments) while it is still in the urinary tract. The fragments of stone can pass more easily out of your body in the urine. Tell a health care provider about: Any allergies you have. All medicines you are taking, including vitamins, herbs, eye drops, creams, and over-the-counter medicines. Any problems you or family members have had with anesthetic medicines. Any bleeding problems you have. Any surgeries you have had. Any medical conditions you have. Whether you are pregnant or may be pregnant. What are the risks? Your health care provider will talk with you about risks. These may include: Infection. Bleeding from the kidney. Bruising of the kidney or skin. Scarring of the kidney. This can lead to: Increased blood pressure. Poor kidney function. Return (recurrence) of kidney stones. Damage to other structures or organs. This may include the liver, colon, spleen, or pancreas. Blockage (obstruction) of the tube that carries urine from the kidney to the bladder (ureter). Failure of the kidney stone to break into fragments. What happens before the procedure? When to stop eating and drinking Follow instructions from your health care provider about what you may eat and drink. These may include: 8 hours before your procedure Stop eating most foods. Do not eat meat, fried foods, or fatty foods. Eat only light foods, such as toast or crackers. All liquids are okay except energy drinks and alcohol. 6 hours before your procedure Stop eating. Drink only clear liquids, such as water, clear fruit juice, black coffee, plain tea,  and sports drinks. Do not drink energy drinks or alcohol. 2 hours before your procedure Stop drinking all liquids. You may be allowed to take medicines with small sips of water. If you do not follow your health care provider's instructions, your procedure may be delayed or canceled. Medicines Ask your health care provider about: Changing or stopping your regular medicines. These include any diabetes medicines or blood thinners you take. Taking medicines such as aspirin and ibuprofen. These medicines can thin your blood. Do not take them unless your health care provider tells you to. Taking over-the-counter medicines, vitamins, herbs, and supplements. Tests You may have tests, such as: Blood tests. Urine tests. Imaging tests. This may include a CT scan. Surgery safety Ask your health care provider: How your surgery site will be marked. What steps will be taken to help prevent infection. These steps may include: Washing skin with a soap that kills germs. Receiving antibiotics. General instructions If you will be going home right after the procedure, plan to have a responsible adult: Take you home from the hospital or clinic. You will not be allowed to drive. Care for you for the time you are told. What happens during the procedure?  An IV will be inserted into one of your veins. You may be given: A sedative. This helps you relax. Anesthesia. This will: Numb certain areas of your body. Make you fall asleep for surgery. A water-filled cushion may be placed behind your kidney or on your abdomen. In some cases, you may be placed in a tub of lukewarm water. Your body will be positioned in a way that makes it   easier to target the kidney stone. An X-ray or ultrasound exam will be done to locate your stone. Shock waves will be aimed at the stone. If you are awake, you may feel a tapping sensation as the shock waves pass through your body. A small mesh tube (stent) may be placed in your  ureter. This will help keep urine flowing from the kidney if the fragments of the stone have been blocking the ureter. The stent will be removed at a later time by your health care provider. The procedure may vary among health care providers and hospitals. What happens after the procedure? Your blood pressure, heart rate, breathing rate, and blood oxygen level will be monitored until you leave the hospital or clinic. You may have an X-ray after the procedure to see how many of the kidney stones were broken up. This will also show how much of the stone has passed. If there are still large fragments after treatment, you may need to have a second procedure at a later time. This information is not intended to replace advice given to you by your health care provider. Make sure you discuss any questions you have with your health care provider. Document Revised: 10/25/2021 Document Reviewed: 10/25/2021 Elsevier Patient Education  2024 [employer]  

## 2022-11-29 NOTE — Progress Notes (Signed)
I spoke with [employer]. We have discussed possible surgery dates and 12/03/2022 was agreed upon by all parties. Patient given information about surgery date, what to expect pre-operatively and post operatively.    We discussed that a pre-op nurse will be calling to set up the pre-op visit that will take place prior to surgery. Informed patient that our office will communicate any additional care to be provided after surgery.    Patients questions or concerns were discussed during our call. Advised to call our office should there be any additional information, questions or concerns that arise. Patient verbalized understanding.

## 2022-11-29 NOTE — Pre-Procedure Instructions (Signed)
Attempted pre-op phone call. Left VM for him to call us back. 

## 2022-11-29 NOTE — H&P (View-Only) (Signed)
11/29/2022 11:23 AM   Spencer Serrano 23-Jan-1956 [id number]  Referring provider: Assunta Found, MD [address] [place],  [place] [address]  Chief Complaint  Patient presents with   Nephrolithiasis    Left     HPI: Mr Tardy is a 67yo here for followup for nephrolithiasis. He presented to ER on 5/13 and was diagnosed with a 7mm left proximal ureteral calculus. He has had 6 prior stone events. No prior surgery. He has very mild left flank pain currently. KUB from today shows a 7mm left proximal ureteral calculus   PMH: Past Medical History:  Diagnosis Date   Gout    History of kidney stones    Hypercholesteremia    Hypertension     Surgical History: Past Surgical History:  Procedure Laterality Date   CARDIAC CATHETERIZATION     COLONOSCOPY     COLONOSCOPY N/A 10/17/2014   Procedure: COLONOSCOPY;  Surgeon: Corbin Ade, MD;  [facility];  Service: Endoscopy;  Laterality: N/A;  830   COLONOSCOPY N/A 01/14/2018   Procedure: COLONOSCOPY;  Surgeon: Corbin Ade, MD;  Location: [facility];  Service: Endoscopy;  Laterality: N/A;  9:00    Home Medications:  Allergies as of 11/29/2022       Reactions   Penicillins Other (See Comments)   Passed out after getting penicillin shot Has patient had a PCN reaction causing immediate rash, facial/tongue/throat swelling, SOB or lightheadedness with hypotension: Yes Has patient had a PCN reaction causing severe rash involving mucus membranes or skin necrosis: No Has patient had a PCN reaction that required hospitalization: Yes - MD office Has patient had a PCN reaction occurring within the last 10 years: No If all of the above answers are "NO", then may proceed with Cephalosporin use.        Medication List        Accurate as of Nov 29, 2022 11:23 AM. If you have any questions, ask your nurse or doctor.          allopurinol 100 MG tablet Commonly known as: ZYLOPRIM Take 50 mg by mouth daily.    aspirin 81 MG tablet Take 81 mg by mouth daily.   lisinopril 20 MG tablet Commonly known as: ZESTRIL Take 20 mg by mouth 2 (two) times daily.   ondansetron 4 MG disintegrating tablet Commonly known as: ZOFRAN-ODT Take 1 tablet (4 mg total) by mouth every 8 (eight) hours as needed.   oxyCODONE-acetaminophen 5-325 MG tablet Commonly known as: PERCOCET/ROXICET Take 1 tablet by mouth every 6 (six) hours as needed for severe pain.   rosuvastatin 20 MG tablet Commonly known as: CRESTOR Take 20 mg by mouth daily.   tadalafil 5 MG tablet Commonly known as: CIALIS Take 5-20 mg by mouth daily as needed for erectile dysfunction.   tamsulosin 0.4 MG Caps capsule Commonly known as: FLOMAX Take 1 capsule (0.4 mg total) by mouth daily.   traMADol-acetaminophen 37.5-325 MG tablet Commonly known as: ULTRACET Take 1 tablet by mouth every 4 (four) hours as needed.        Allergies:  Allergies  Allergen Reactions   Penicillins Other (See Comments)    Passed out after getting penicillin shot Has patient had a PCN reaction causing immediate rash, facial/tongue/throat swelling, SOB or lightheadedness with hypotension: Yes Has patient had a PCN reaction causing severe rash involving mucus membranes or skin necrosis: No Has patient had a PCN reaction that required hospitalization: Yes - MD office Has patient  had a PCN reaction occurring within the last 10 years: No If all of the above answers are "NO", then may proceed with Cephalosporin use.    Family History: No family history on file.  Social History:  reports that he has never smoked. He has never used smokeless tobacco. He reports current alcohol use. He reports that he does not use drugs.  ROS: All other review of systems were reviewed and are negative except what is noted above in HPI  Physical Exam: BP (!) 145/72   Pulse 68   Constitutional:  Alert and oriented, No acute distress. HEENT: [place] AT, moist mucus membranes.   Trachea midline, no masses. Cardiovascular: No clubbing, cyanosis, or edema. Respiratory: Normal respiratory effort, no increased work of breathing. GI: Abdomen is soft, nontender, nondistended, no abdominal masses GU: No CVA tenderness.  Lymph: No cervical or inguinal lymphadenopathy. Skin: No rashes, bruises or suspicious lesions. Neurologic: Grossly intact, no focal deficits, moving all 4 extremities. Psychiatric: Normal mood and affect.  Laboratory Data: Lab Results  Component Value Date   WBC 10.6 (H) 11/18/2022   HGB 13.4 11/18/2022   HCT 41.3 11/18/2022   MCV 90.6 11/18/2022   PLT 314 11/18/2022    Lab Results  Component Value Date   CREATININE 1.44 (H) 11/18/2022    No results found for: "PSA"  No results found for: "TESTOSTERONE"  No results found for: "HGBA1C"  Urinalysis    Component Value Date/Time   COLORURINE YELLOW 11/18/2022 1122   APPEARANCEUR CLEAR 11/18/2022 1122   LABSPEC 1.021 11/18/2022 1122   PHURINE 5.0 11/18/2022 1122   GLUCOSEU NEGATIVE 11/18/2022 1122   HGBUR NEGATIVE 11/18/2022 1122   BILIRUBINUR NEGATIVE 11/18/2022 1122   KETONESUR NEGATIVE 11/18/2022 1122   PROTEINUR NEGATIVE 11/18/2022 1122   UROBILINOGEN 0.2 05/13/2009 2030   NITRITE NEGATIVE 11/18/2022 1122   LEUKOCYTESUR NEGATIVE 11/18/2022 1122    Lab Results  Component Value Date   BACTERIA RARE 05/13/2009    Pertinent Imaging: CT 11/18/2022: Images reviewed and discussed with the patient No results found for this or any previous visit.  No results found for this or any previous visit.  No results found for this or any previous visit.  No results found for this or any previous visit.  No results found for this or any previous visit.  No valid procedures specified. No results found for this or any previous visit.  Results for orders placed during the hospital encounter of 11/18/22  CT Renal Stone Study  Narrative CLINICAL DATA:  Abdominal pain.  Flank  pain  EXAM: CT ABDOMEN AND PELVIS WITHOUT CONTRAST  TECHNIQUE: Multidetector CT imaging of the abdomen and pelvis was performed following the standard protocol without IV contrast.  RADIATION DOSE REDUCTION: This exam was performed according to the departmental dose-optimization program which includes automated exposure control, adjustment of the mA and/or kV according to patient size and/or use of iterative reconstruction technique.  COMPARISON:  CT 2010 November  FINDINGS: Lower chest: There is some linear opacity lung bases likely scar or atelectasis. No pleural effusion. Coronary artery calcifications are seen.  Hepatobiliary: No focal liver abnormality is seen. No gallstones, gallbladder wall thickening, or biliary dilatation.  Pancreas: Unremarkable. No pancreatic ductal dilatation or surrounding inflammatory changes.  Spleen: Normal in size without focal abnormality.  Adrenals/Urinary Tract: The adrenal glands are preserved. 3 mm nonobstructing upper pole right-sided renal stone. There is a exophytic low-attenuation lesion from the lower pole of the right kidney measuring 4.2 cm in  diameter and has Hounsfield units of 15, likely a cyst. No right ureteral stone. Relatively contracted urinary bladder.  Left kidney is slightly enlarged compared to right, there is moderate perinephric stranding as well. There is a stone in the very proximal left ureter just distal to the ureteropelvic junction. On coronal image 73 of series 5 stone measures 7 mm. No additional ureteral stones. There are additional small lower pole stones measuring up to 4 mm.  Stomach/Bowel: On this non oral contrast exam, the large bowel has a normal course and caliber. Few scattered colonic diverticula identified, particularly of the sigmoid colon. Large bowel is nondilated. Normal appendix. Stomach and small bowel are nondilated on this non oral contrast exam. There are some small bowel  stool appearance in the right lower quadrant, nonspecific.  Vascular/Lymphatic: Normal caliber aorta and IVC with some scattered vascular calcifications. No abnormal lymph node enlargement identified in the abdomen and pelvis.  Reproductive: Prostate is unremarkable.  Other: Small fat containing inguinal hernias. No free air or free fluid.  Musculoskeletal: Scattered degenerative changes of the spine and pelvis. Multilevel Schmorl's node deformities along the spine  IMPRESSION: 7 mm vertically measured stone in the proximal left ureter with mild collecting system dilatation.  Additional bilateral nonobstructing stones.  Colonic diverticula.  Normal appendix.  Exophytic cyst from the lower pole of the right kidney, likely a simple cyst, Bosniak 2. No specific imaging follow-up   Electronically Signed By: Karen Kays M.D. On: 11/18/2022 09:47   Assessment & Plan:    1. Kidney stones -We discussed the management of kidney stones. These options include observation, ureteroscopy, shockwave lithotripsy (ESWL) and percutaneous nephrolithotomy (PCNL). We discussed which options are relevant to the patient's stone(s). We discussed the natural history of kidney stones as well as the complications of untreated stones and the impact on quality of life without treatment as well as with each of the above listed treatments. We also discussed the efficacy of each treatment in its ability to clear the stone burden. With any of these management options I discussed the signs and symptoms of infection and the need for emergent treatment should these be experienced. For each option we discussed the ability of each procedure to clear the patient of their stone burden.   For observation I described the risks which include but are not limited to silent renal damage, life-threatening infection, need for emergent surgery, failure to pass stone and pain.   For ureteroscopy I described the risks which  include bleeding, infection, damage to contiguous structures, positioning injury, ureteral stricture, ureteral avulsion, ureteral injury, need for prolonged ureteral stent, inability to perform ureteroscopy, need for an interval procedure, inability to clear stone burden, stent discomfort/pain, heart attack, stroke, pulmonary embolus and the inherent risks with general anesthesia.   For shockwave lithotripsy I described the risks which include arrhythmia, kidney contusion, kidney hemorrhage, need for transfusion, pain, inability to adequately break up stone, inability to pass stone fragments, Steinstrasse, infection associated with obstructing stones, need for alternate surgical procedure, need for repeat shockwave lithotripsy, MI, CVA, PE and the inherent risks with anesthesia/conscious sedation.   For PCNL I described the risks including positioning injury, pneumothorax, hydrothorax, need for chest tube, inability to clear stone burden, renal laceration, arterial venous fistula or malformation, need for embolization of kidney, loss of kidney or renal function, need for repeat procedure, need for prolonged nephrostomy tube, ureteral avulsion, MI, CVA, PE and the inherent risks of general anesthesia.   - The patient would  like to proceed with left ESWL - Urinalysis, Routine w reflex microscopic   No follow-ups on file.  Wilkie Aye, MD  [facility] [place]

## 2022-12-03 ENCOUNTER — Encounter: Payer: Self-pay | Admitting: Urology

## 2022-12-03 ENCOUNTER — Ambulatory Visit (HOSPITAL_COMMUNITY)
Admission: RE | Admit: 2022-12-03 | Discharge: 2022-12-03 | Disposition: A | Discharge status: Home / Self Care | Payer: PPO | Source: Home / Self Care | Attending: Urology | Admitting: Urology

## 2022-12-03 ENCOUNTER — Encounter (HOSPITAL_COMMUNITY): Payer: Self-pay | Admitting: Urology

## 2022-12-03 ENCOUNTER — Ambulatory Visit (HOSPITAL_COMMUNITY)
Admission: RE | Admit: 2022-12-03 | Discharge: 2022-12-03 | Disposition: A | Discharge status: Home / Self Care | Payer: PPO | Attending: Urology | Admitting: Urology

## 2022-12-03 ENCOUNTER — Encounter (HOSPITAL_COMMUNITY)
Admission: RE | Disposition: A | Discharge status: Home / Self Care | Payer: Self-pay | Source: Home / Self Care | Attending: Urology

## 2022-12-03 DIAGNOSIS — I10 Essential (primary) hypertension: Secondary | ICD-10-CM | POA: Insufficient documentation

## 2022-12-03 DIAGNOSIS — N201 Calculus of ureter: Secondary | ICD-10-CM | POA: Insufficient documentation

## 2022-12-03 HISTORY — PX: EXTRACORPOREAL SHOCK WAVE LITHOTRIPSY: SHX1557

## 2022-12-03 SURGERY — LITHOTRIPSY, ESWL
Anesthesia: LOCAL | Laterality: Left

## 2022-12-03 MED ORDER — DIPHENHYDRAMINE HCL 25 MG PO CAPS
ORAL_CAPSULE | ORAL | Status: AC
  Administered 2022-12-03: 25 mg via ORAL
  Filled 2022-12-03: qty 1

## 2022-12-03 MED ORDER — SODIUM CHLORIDE 0.9 % IV SOLN: INTRAVENOUS | Status: DC

## 2022-12-03 MED ORDER — OXYCODONE-ACETAMINOPHEN 5-325 MG PO TABS: 1.0000 | ORAL_TABLET | Freq: Four times a day (QID) | ORAL | 0 refills | Status: DC | PRN

## 2022-12-03 MED ORDER — ONDANSETRON 4 MG PO TBDP: 4.0000 mg | ORAL_TABLET | Freq: Three times a day (TID) | ORAL | 0 refills | Status: DC | PRN

## 2022-12-03 MED ORDER — DIAZEPAM 5 MG PO TABS: 10.0000 mg | ORAL_TABLET | Freq: Once | ORAL | Status: AC

## 2022-12-03 MED ORDER — TAMSULOSIN HCL 0.4 MG PO CAPS: 0.4000 mg | ORAL_CAPSULE | Freq: Every day | ORAL | 1 refills | Status: DC

## 2022-12-03 MED ORDER — DIAZEPAM 5 MG PO TABS
ORAL_TABLET | ORAL | Status: AC
  Administered 2022-12-03: 10 mg via ORAL
  Filled 2022-12-03: qty 2

## 2022-12-03 MED ORDER — DIPHENHYDRAMINE HCL 25 MG PO CAPS: 25.0000 mg | ORAL_CAPSULE | ORAL | Status: AC

## 2022-12-03 NOTE — Interval H&P Note (Signed)
History and Physical Interval Note:  12/03/2022 10:21 AM  Spencer Serrano  has presented today for surgery, with the diagnosis of left ureteral calculus.  The various methods of treatment have been discussed with the patient and family. After consideration of risks, benefits and other options for treatment, the patient has consented to  Procedure(s): EXTRACORPOREAL SHOCK WAVE LITHOTRIPSY (ESWL) (Left) as a surgical intervention.  The patient's history has been reviewed, patient examined, no change in status, stable for surgery.  I have reviewed the patient's chart and labs.  Questions were answered to the patient's satisfaction.     Wilkie Aye

## 2022-12-04 ENCOUNTER — Encounter (HOSPITAL_COMMUNITY): Payer: Self-pay | Admitting: Urology

## 2022-12-16 ENCOUNTER — Other Ambulatory Visit: Payer: Self-pay | Admitting: Urology

## 2022-12-16 ENCOUNTER — Ambulatory Visit (HOSPITAL_COMMUNITY)
Admission: RE | Admit: 2022-12-16 | Discharge: 2022-12-16 | Disposition: A | Discharge status: Home / Self Care | Payer: PPO | Source: Ambulatory Visit | Attending: Urology | Admitting: Urology

## 2022-12-16 DIAGNOSIS — N200 Calculus of kidney: Secondary | ICD-10-CM | POA: Insufficient documentation

## 2022-12-16 NOTE — Progress Notes (Unsigned)
Diagnoses: 1) Post-operative state  HPI: Spencer Serrano Nurse presents post-operatively s/p left ESWL procedure on 12/03/2022 by Dr. Ronne Binning.  Postop course: KUB yesterday (12/16/2022): Awaiting radiology read; no stone appreciated along anticipated course of left ureter.  Today He reports doing well. He denies any flank pain, abdominal pain, fevers, increased urinary urgency, frequency, dysuria, gross hematuria, straining to void, or sensations of incomplete emptying. He has not seen any stone fragments pass.   Fall Screening: Do you usually have a device to assist in your mobility? No    Medications: Current Outpatient Medications  Medication Sig Dispense Refill   allopurinol (ZYLOPRIM) 100 MG tablet Take 50 mg by mouth daily.     aspirin 81 MG tablet Take 81 mg by mouth daily.     lisinopril (PRINIVIL,ZESTRIL) 20 MG tablet Take 20 mg by mouth 2 (two) times daily.     rosuvastatin (CRESTOR) 20 MG tablet Take 20 mg by mouth daily.     tadalafil (CIALIS) 5 MG tablet Take 5-20 mg by mouth daily as needed for erectile dysfunction.     No current facility-administered medications for this visit.    Allergies: Allergies  Allergen Reactions   Penicillins Other (See Comments)    Passed out after getting penicillin shot Has patient had a PCN reaction causing immediate rash, facial/tongue/throat swelling, SOB or lightheadedness with hypotension: Yes Has patient had a PCN reaction causing severe rash involving mucus membranes or skin necrosis: No Has patient had a PCN reaction that required hospitalization: Yes - MD office Has patient had a PCN reaction occurring within the last 10 years: No If all of the above answers are "NO", then may proceed with Cephalosporin use.    Past Medical History:  Diagnosis Date   Gout    History of kidney stones    Hypercholesteremia    Hypertension    Past Surgical History:  Procedure Laterality Date   CARDIAC CATHETERIZATION     COLONOSCOPY      COLONOSCOPY N/A 10/17/2014   Procedure: COLONOSCOPY;  Surgeon: Corbin Ade, MD;  [facility];  Service: Endoscopy;  Laterality: N/A;  830   COLONOSCOPY N/A 01/14/2018   Procedure: COLONOSCOPY;  Surgeon: Corbin Ade, MD;  Location: [facility];  Service: Endoscopy;  Laterality: N/A;  9:00   EXTRACORPOREAL SHOCK WAVE LITHOTRIPSY Left 12/03/2022   Procedure: EXTRACORPOREAL SHOCK WAVE LITHOTRIPSY (ESWL);  Surgeon: Malen Gauze, MD;  Location: [facility];  Service: Urology;  Laterality: Left;   No family history on file. Social History   Socioeconomic History   Marital status: Married    Spouse name: Not on file   Number of children: Not on file   Years of education: Not on file   Highest education level: Not on file  Occupational History   Not on file  Tobacco Use   Smoking status: Never   Smokeless tobacco: Never  Vaping Use   Vaping Use: Never used  Substance and Sexual Activity   Alcohol use: Yes    Alcohol/week: 0.0 standard drinks of alcohol    Comment: "Very Rarely"   Drug use: No   Sexual activity: Not on file  Other Topics Concern   Not on file  Social History Narrative   Not on file   Social Determinants of Health   Financial Resource Strain: Not on file  Food Insecurity: Not on file  Transportation Needs: Not on file  Physical Activity: Not on file  Stress: Not on  file  Social Connections: Not on file  Intimate Partner Violence: Not on file    SUBJECTIVE  Review of Systems Constitutional: Patient denies any unintentional weight loss or change in strength lntegumentary: Patient denies any rashes or pruritus Eyes: Patient denies dry eyes ENT: Patient denies dry mouth Cardiovascular: Patient denies chest pain or syncope Respiratory: Patient denies shortness of breath Gastrointestinal: Patient denies nausea, vomiting, constipation, or diarrhea Musculoskeletal: Patient denies muscle cramps or weakness Neurologic: Patient denies  convulsions or seizures Psychiatric: Patient denies memory problems Allergic/Immunologic: Patient denies recent allergic reaction(s) Hematologic/Lymphatic: Patient denies bleeding tendencies Endocrine: Patient denies heat/cold intolerance  GU: As per HPI.  OBJECTIVE Vitals:   12/17/22 1154  BP: 129/64  Pulse: 65   There is no height or weight on file to calculate BMI.  Physical Examination  Constitutional: No obvious distress; patient is non-toxic appearing  Cardiovascular: No visible lower extremity edema.  Respiratory: The patient does not have audible wheezing/stridor; respirations do not appear labored  Gastrointestinal: Abdomen non-distended Musculoskeletal: Normal ROM of UEs  Skin: No obvious rashes/open sores  Neurologic: CN 2-12 grossly intact Psychiatric: Answered questions appropriately with normal affect  Hematologic/Lymphatic/Immunologic: No obvious bruises or sites of spontaneous bleeding  UA: positive 3-10 RBC/hpf  ASSESSMENT Kidney stones - Plan: Urinalysis, Routine w reflex microscopic, DG Abd 1 View  Postop check  He is doing well. We reviewed the operative procedures and findings. Pre-operative symptoms are resolved since the procedure.   Will plan for follow up in 6 months with KUB for stone surveillance or sooner if needed. Pt verbalized understanding and agreement. All questions were answered.  PLAN Advised the following: Return in about 6 months (around 06/18/2023) for KUB, UA, & f/u with Evette Georges NP.  Orders Placed This Encounter  Procedures   DG Abd 1 View    Standing Status:   Future    Standing Expiration Date:   12/17/2023    Order Specific Question:   Reason for Exam (SYMPTOM  OR DIAGNOSIS REQUIRED)    Answer:   kidney stone    Order Specific Question:   Preferred imaging location?    Answer:   [facility]   Urinalysis, Routine w reflex microscopic    It has been explained that the patient is to follow regularly with their  PCP in addition to all other providers involved in their care and to follow instructions provided by these respective offices. Patient advised to contact urology clinic if any urologic-pertaining questions, concerns, new symptoms or problems arise in the interim period.  There are no Patient Instructions on file for this visit.  Electronically signed by:  Donnita Falls, MSN, FNP-C, CUNP 12/17/2022 12:14 PM

## 2022-12-17 ENCOUNTER — Encounter: Payer: Self-pay | Admitting: Urology

## 2022-12-17 ENCOUNTER — Ambulatory Visit (INDEPENDENT_AMBULATORY_CARE_PROVIDER_SITE_OTHER): Payer: PPO | Admitting: Urology

## 2022-12-17 VITALS — BP 129/64 | HR 65

## 2022-12-17 DIAGNOSIS — Z87442 Personal history of urinary calculi: Secondary | ICD-10-CM

## 2022-12-17 DIAGNOSIS — Z09 Encounter for follow-up examination after completed treatment for conditions other than malignant neoplasm: Secondary | ICD-10-CM

## 2022-12-17 DIAGNOSIS — N200 Calculus of kidney: Secondary | ICD-10-CM

## 2022-12-17 LAB — URINALYSIS, ROUTINE W REFLEX MICROSCOPIC
Bilirubin, UA: NEGATIVE
Glucose, UA: NEGATIVE
Ketones, UA: NEGATIVE
Leukocytes,UA: NEGATIVE
Nitrite, UA: NEGATIVE
Protein,UA: NEGATIVE
Specific Gravity, UA: 1.02 (ref 1.005–1.030)
Urobilinogen, Ur: 0.2 mg/dL (ref 0.2–1.0)
pH, UA: 6 (ref 5.0–7.5)

## 2022-12-17 LAB — MICROSCOPIC EXAMINATION: Bacteria, UA: NONE SEEN

## 2023-01-15 ENCOUNTER — Other Ambulatory Visit: Payer: Self-pay

## 2023-01-15 ENCOUNTER — Telehealth: Payer: Self-pay

## 2023-01-15 DIAGNOSIS — N200 Calculus of kidney: Secondary | ICD-10-CM

## 2023-01-15 NOTE — Telephone Encounter (Signed)
Stone analysis order placed

## 2023-01-15 NOTE — Telephone Encounter (Signed)
Patient dropped off a kidney stone he recently passed.  Given to lab.

## 2023-01-21 ENCOUNTER — Encounter: Payer: Self-pay | Admitting: *Deleted

## 2023-01-24 LAB — CALCULI, WITH PHOTOGRAPH (CLINICAL LAB)
Calcium Oxalate Monohydrate: 100 %
Weight Calculi: 108 mg

## 2023-02-18 ENCOUNTER — Telehealth: Payer: Self-pay | Admitting: Internal Medicine

## 2023-02-18 NOTE — Telephone Encounter (Signed)
Questionnaire is in review.

## 2023-02-21 NOTE — Addendum Note (Signed)
Addended by: Armstead Peaks on: 02/21/2023 08:57 AM   Modules accepted: Orders

## 2023-02-21 NOTE — Telephone Encounter (Addendum)
  Procedure: Colonoscopy  Height: 6'1 Weight: 240lbs        Have you had a colonoscopy before?  01/14/18, Dr. Jena Gauss  Do you have family history of colon cancer?  no  Do you have a family history of polyps? Not sure  Previous colonoscopy with polyps removed? yes  Do you have a history colorectal cancer?   no  Are you diabetic?  no  Do you have a prosthetic or mechanical heart valve? no  Do you have a pacemaker/defibrillator?   no  Have you had endocarditis/atrial fibrillation?  no  Do you use supplemental oxygen/CPAP?  no  Have you had joint replacement within the last 12 months?  no  Do you tend to be constipated or have to use laxatives?  no   Do you have history of alcohol use? If yes, how much and how often.  no  Do you have history or are you using drugs? If yes, what do are you  using?  no  Have you ever had a stroke/heart attack?  no  Have you ever had a heart or other vascular stent placed,?no  Do you take weight loss medication? no  Do you take any blood-thinning medications such as: (Plavix, aspirin, Coumadin, Aggrenox, Brilinta, Xarelto, Eliquis, Pradaxa, Savaysa or Effient)? no  If yes we need the name, milligram, dosage and who is prescribing doctor:                Current Outpatient Medications on File Prior to Visit  Medication Sig Dispense Refill   allopurinol (ZYLOPRIM) 100 MG tablet Take 50 mg by mouth daily.     lisinopril (PRINIVIL,ZESTRIL) 20 MG tablet Take 20 mg by mouth 2 (two) times daily.     rosuvastatin (CRESTOR) 20 MG tablet Take 20 mg by mouth daily.     No current facility-administered medications on file prior to visit.        Allergies  Allergen Reactions   Penicillins Other (See Comments)    Passed out after getting penicillin shot Has patient had a PCN reaction causing immediate rash, facial/tongue/throat swelling, SOB or lightheadedness with hypotension: Yes Has patient had a PCN reaction causing severe rash involving mucus  membranes or skin necrosis: No Has patient had a PCN reaction that required hospitalization: Yes - MD office Has patient had a PCN reaction occurring within the last 10 years: No If all of the above answers are "NO", then may proceed with Cephalosporin use.

## 2023-05-27 DIAGNOSIS — Q825 Congenital non-neoplastic nevus: Secondary | ICD-10-CM | POA: Diagnosis not present

## 2023-05-27 DIAGNOSIS — Z85828 Personal history of other malignant neoplasm of skin: Secondary | ICD-10-CM | POA: Diagnosis not present

## 2023-05-27 DIAGNOSIS — L570 Actinic keratosis: Secondary | ICD-10-CM | POA: Diagnosis not present

## 2023-05-27 DIAGNOSIS — L723 Sebaceous cyst: Secondary | ICD-10-CM | POA: Diagnosis not present

## 2023-06-16 ENCOUNTER — Ambulatory Visit: Payer: BC Managed Care – PPO | Admitting: Urology

## 2023-06-20 NOTE — Telephone Encounter (Signed)
Yes he is a triage from September that looks like was missed.

## 2023-06-23 MED ORDER — PEG 3350-KCL-NA BICARB-NACL 420 G PO SOLR: 4000.0000 mL | Freq: Once | ORAL | 0 refills | Status: AC

## 2023-06-23 NOTE — Telephone Encounter (Signed)
LMOVM to call back 

## 2023-06-23 NOTE — Telephone Encounter (Signed)
Questionnaire from recall, no referral needed  

## 2023-06-23 NOTE — Telephone Encounter (Signed)
Spoke with pt. He has been scheduled for 1/16 at 9am. Aware will send new instructions to him. Rx for prep to pharmacy. Confirmed address/pharmacy. His [employer] advantage plan will be change 2025 to a different plan with [employer] and not sure if member ID # will change. He is aware if it does, we will need info and copy of card.

## 2023-06-23 NOTE — Addendum Note (Signed)
Addended by: Armstead Peaks on: 06/23/2023 11:43 AM   Modules accepted: Orders

## 2023-07-23 NOTE — Anesthesia Preprocedure Evaluation (Addendum)
Anesthesia Evaluation  Patient identified by MRN, date of birth, ID band Patient awake    Reviewed: Allergy & Precautions, H&P , NPO status , Patient's Chart, lab work & pertinent test results, reviewed documented beta blocker date and time   Airway Mallampati: II  TM Distance: >3 FB Neck ROM: full    Dental no notable dental hx. (+) Teeth Intact, Dental Advisory Given   Pulmonary neg pulmonary ROS Stop bang 3   Pulmonary exam normal breath sounds clear to auscultation       Cardiovascular Exercise Tolerance: Good hypertension, Normal cardiovascular exam Rhythm:regular Rate:Normal     Neuro/Psych negative neurological ROS  negative psych ROS   GI/Hepatic negative GI ROS, Neg liver ROS,,,  Endo/Other  negative endocrine ROS    Renal/GU negative Renal ROS  negative genitourinary   Musculoskeletal   Abdominal   Peds  Hematology negative hematology ROS (+)   Anesthesia Other Findings   Reproductive/Obstetrics negative OB ROS                             Anesthesia Physical Anesthesia Plan  ASA: 2  Anesthesia Plan: General   Post-op Pain Management: Minimal or no pain anticipated   Induction: Intravenous  PONV Risk Score and Plan: Propofol infusion  Airway Management Planned: Nasal Cannula and Natural Airway  Additional Equipment: None  Intra-op Plan:   Post-operative Plan:   Informed Consent: I have reviewed the patients History and Physical, chart, labs and discussed the procedure including the risks, benefits and alternatives for the proposed anesthesia with the patient or authorized representative who has indicated his/her understanding and acceptance.     Dental Advisory Given  Plan Discussed with: CRNA  Anesthesia Plan Comments:         Anesthesia Quick Evaluation

## 2023-07-24 ENCOUNTER — Ambulatory Visit (HOSPITAL_COMMUNITY)
Admission: RE | Admit: 2023-07-24 | Discharge: 2023-07-24 | Disposition: A | Discharge status: Home / Self Care | Payer: PPO | Attending: Internal Medicine | Admitting: Internal Medicine

## 2023-07-24 ENCOUNTER — Encounter (HOSPITAL_COMMUNITY)
Admission: RE | Disposition: A | Discharge status: Home / Self Care | Payer: Self-pay | Source: Home / Self Care | Attending: Internal Medicine

## 2023-07-24 ENCOUNTER — Encounter (HOSPITAL_COMMUNITY): Payer: Self-pay | Admitting: Internal Medicine

## 2023-07-24 ENCOUNTER — Ambulatory Visit (HOSPITAL_COMMUNITY): Payer: Self-pay | Admitting: Anesthesiology

## 2023-07-24 ENCOUNTER — Ambulatory Visit (HOSPITAL_BASED_OUTPATIENT_CLINIC_OR_DEPARTMENT_OTHER): Payer: PPO | Admitting: Anesthesiology

## 2023-07-24 ENCOUNTER — Other Ambulatory Visit: Payer: Self-pay

## 2023-07-24 DIAGNOSIS — D175 Benign lipomatous neoplasm of intra-abdominal organs: Secondary | ICD-10-CM | POA: Diagnosis not present

## 2023-07-24 DIAGNOSIS — D125 Benign neoplasm of sigmoid colon: Secondary | ICD-10-CM | POA: Diagnosis not present

## 2023-07-24 DIAGNOSIS — I10 Essential (primary) hypertension: Secondary | ICD-10-CM | POA: Insufficient documentation

## 2023-07-24 DIAGNOSIS — Z1211 Encounter for screening for malignant neoplasm of colon: Secondary | ICD-10-CM | POA: Diagnosis not present

## 2023-07-24 DIAGNOSIS — D120 Benign neoplasm of cecum: Secondary | ICD-10-CM | POA: Insufficient documentation

## 2023-07-24 DIAGNOSIS — Z860101 Personal history of adenomatous and serrated colon polyps: Secondary | ICD-10-CM | POA: Insufficient documentation

## 2023-07-24 DIAGNOSIS — K5730 Diverticulosis of large intestine without perforation or abscess without bleeding: Secondary | ICD-10-CM | POA: Insufficient documentation

## 2023-07-24 DIAGNOSIS — K635 Polyp of colon: Secondary | ICD-10-CM | POA: Diagnosis not present

## 2023-07-24 DIAGNOSIS — Z860100 Personal history of colon polyps, unspecified: Secondary | ICD-10-CM

## 2023-07-24 HISTORY — PX: POLYPECTOMY: SHX5525

## 2023-07-24 HISTORY — PX: COLONOSCOPY WITH PROPOFOL: SHX5780

## 2023-07-24 SURGERY — COLONOSCOPY WITH PROPOFOL
Anesthesia: General

## 2023-07-24 MED ORDER — SODIUM CHLORIDE 0.9% FLUSH: 3.0000 mL | Freq: Two times a day (BID) | INTRAVENOUS | Status: DC

## 2023-07-24 MED ORDER — LIDOCAINE HCL (PF) 2 % IJ SOLN
INTRAMUSCULAR | Status: DC | PRN
  Administered 2023-07-24: 50 mg via INTRADERMAL

## 2023-07-24 MED ORDER — PROPOFOL 500 MG/50ML IV EMUL
INTRAVENOUS | Status: DC | PRN
  Administered 2023-07-24: 150 ug/kg/min via INTRAVENOUS
  Administered 2023-07-24: 100 mg via INTRAVENOUS

## 2023-07-24 MED ORDER — PHENYLEPHRINE 80 MCG/ML (10ML) SYRINGE FOR IV PUSH (FOR BLOOD PRESSURE SUPPORT)
PREFILLED_SYRINGE | INTRAVENOUS | Status: DC | PRN
  Administered 2023-07-24 (×2): 80 ug via INTRAVENOUS
  Administered 2023-07-24 (×2): 160 ug via INTRAVENOUS

## 2023-07-24 MED ORDER — SODIUM CHLORIDE 0.9% FLUSH: 3.0000 mL | INTRAVENOUS | Status: DC | PRN

## 2023-07-24 MED ORDER — LACTATED RINGERS IV SOLN: INTRAVENOUS | Status: DC | PRN

## 2023-07-24 MED ORDER — STERILE WATER FOR IRRIGATION IR SOLN
Status: DC | PRN
  Administered 2023-07-24: 50 mL

## 2023-07-24 NOTE — Transfer of Care (Signed)
Immediate Anesthesia Transfer of Care Note  Patient: Spencer Serrano  Procedure(s) Performed: COLONOSCOPY WITH PROPOFOL POLYPECTOMY  Patient Location: Endoscopy Unit  Anesthesia Type:General  Level of Consciousness: drowsy  Airway & Oxygen Therapy: Patient Spontanous Breathing  Post-op Assessment: Report given to RN and Post -op Vital signs reviewed and stable  Post vital signs: Reviewed and stable  Last Vitals:  Vitals Value Taken Time  BP    Temp    Pulse    Resp    SpO2      Last Pain:  Vitals:   07/24/23 0816  TempSrc:   PainSc: 0-No pain      Patients Stated Pain Goal: 7 (07/24/23 0748)  Complications: No notable events documented.

## 2023-07-24 NOTE — Discharge Instructions (Addendum)
  Colonoscopy Discharge Instructions  Read the instructions outlined below and refer to this sheet in the next few weeks. These discharge instructions provide you with general information on caring for yourself after you leave the hospital. Your doctor may also give you specific instructions. While your treatment has been planned according to the most current medical practices available, unavoidable complications occasionally occur. If you have any problems or questions after discharge, call Dr. Jena Gauss at [phone]. ACTIVITY You may resume your regular activity, but move at a slower pace for the next 24 hours.  Take frequent rest periods for the next 24 hours.  Walking will help get rid of the air and reduce the bloated feeling in your belly (abdomen).  No driving for 24 hours (because of the medicine (anesthesia) used during the test).   Do not sign any important legal documents or operate any machinery for 24 hours (because of the anesthesia used during the test).  NUTRITION Drink plenty of fluids.  You may resume your normal diet as instructed by your doctor.  Begin with a light meal and progress to your normal diet. Heavy or fried foods are harder to digest and may make you feel sick to your stomach (nauseated).  Avoid alcoholic beverages for 24 hours or as instructed.  MEDICATIONS You may resume your normal medications unless your doctor tells you otherwise.  WHAT YOU CAN EXPECT TODAY Some feelings of bloating in the abdomen.  Passage of more gas than usual.  Spotting of blood in your stool or on the toilet paper.  IF YOU HAD POLYPS REMOVED DURING THE COLONOSCOPY: No aspirin products for 7 days or as instructed.  No alcohol for 7 days or as instructed.  Eat a soft diet for the next 24 hours.  FINDING OUT THE RESULTS OF YOUR TEST Not all test results are available during your visit. If your test results are not back during the visit, make an appointment with your caregiver to find out the  results. Do not assume everything is normal if you have not heard from your caregiver or the medical facility. It is important for you to follow up on all of your test results.  SEEK IMMEDIATE MEDICAL ATTENTION IF: You have more than a spotting of blood in your stool.  Your belly is swollen (abdominal distention).  You are nauseated or vomiting.  You have a temperature over 101.  You have abdominal pain or discomfort that is severe or gets worse throughout the day.    2 small polyps found and removed today  Colon polyp and diverticulosis information provided  Further recommendations to follow pending review of pathology report  At patient request, I called Darlene at [phone]-call rolled to voicemail-left a message with information.

## 2023-07-24 NOTE — H&P (Signed)
@LOGO @   Primary Care Physician:  Assunta Found, MD Primary Gastroenterologist:  Dr. Jena Gauss  Pre-Procedure History & Physical: HPI:  Spencer Serrano is a 68 y.o. male here for surveillance colonoscopy history of colonic adenoma in the past.  Here for surveillance examination.  Past Medical History:  Diagnosis Date   Gout    History of kidney stones    Hypercholesteremia    Hypertension     Past Surgical History:  Procedure Laterality Date   CARDIAC CATHETERIZATION     COLONOSCOPY     COLONOSCOPY N/A 10/17/2014   Procedure: COLONOSCOPY;  Surgeon: Corbin Ade, MD;  Location: [facility];  Service: Endoscopy;  Laterality: N/A;  830   COLONOSCOPY N/A 01/14/2018   Procedure: COLONOSCOPY;  Surgeon: Corbin Ade, MD;  Location: [facility];  Service: Endoscopy;  Laterality: N/A;  9:00   EXTRACORPOREAL SHOCK WAVE LITHOTRIPSY Left 12/03/2022   Procedure: EXTRACORPOREAL SHOCK WAVE LITHOTRIPSY (ESWL);  Surgeon: Malen Gauze, MD;  Location: [facility];  Service: Urology;  Laterality: Left;    Prior to Admission medications   Medication Sig Start Date End Date Taking? Authorizing Provider  allopurinol (ZYLOPRIM) 100 MG tablet Take 50 mg by mouth daily.   Yes [provider]  lisinopril (PRINIVIL,ZESTRIL) 20 MG tablet Take 20 mg by mouth 2 (two) times daily. 07/26/14  Yes [provider]  rosuvastatin (CRESTOR) 20 MG tablet Take 20 mg by mouth daily. 09/09/22  Yes [provider]    Allergies as of 06/23/2023 - Review Complete 02/21/2023  Allergen Reaction Noted   Penicillins Other (See Comments) 09/20/2014    History reviewed. No pertinent family history.  Social History   Socioeconomic History   Marital status: Married    Spouse name: Not on file   Number of children: Not on file   Years of education: Not on file   Highest education level: Not on file  Occupational History   Not on file  Tobacco Use   Smoking status: Never   Smokeless  tobacco: Never  Vaping Use   Vaping status: Never Used  Substance and Sexual Activity   Alcohol use: Yes    Alcohol/week: 0.0 standard drinks of alcohol    Comment: "Very Rarely"   Drug use: No   Sexual activity: Not on file  Other Topics Concern   Not on file  Social History Narrative   Not on file   Social Drivers of Health   Financial Resource Strain: Not on file  Food Insecurity: Not on file  Transportation Needs: Not on file  Physical Activity: Not on file  Stress: Not on file  Social Connections: Not on file  Intimate Partner Violence: Not on file    Review of Systems: See HPI, otherwise negative ROS  Physical Exam: BP 110/70   Pulse 88   Temp 98.6 F (37 C) (Oral)   Ht 6\' 1"  (1.854 m)   Wt 106.6 kg   SpO2 94%   BMI 31.00 kg/m  General:   Alert,  Well-developed, well-nourished, pleasant and cooperative in NAD Neck:  Supple; no masses or thyromegaly. No significant cervical adenopathy. Lungs:  Clear throughout to auscultation.   No wheezes, crackles, or rhonchi. No acute distress. Heart:  Regular rate and rhythm; no murmurs, clicks, rubs,  or gallops. Abdomen: Non-distended, normal bowel sounds.  Soft and nontender without appreciable mass or hepatosplenomegaly.   Impression/Plan: 68 year old gentleman with history of colonic adenoma removed previously here for a surveillance colonoscopy.  The risks, benefits, limitations, alternatives and imponderables have been reviewed with the patient. Questions have been answered. All parties are agreeable.       Notice: This dictation was prepared with Dragon dictation along with smaller phrase technology. Any transcriptional errors that result from this process are unintentional and may not be corrected upon review.

## 2023-07-24 NOTE — Op Note (Signed)
[facility] Patient Name: Spencer Serrano Procedure Date: 07/24/2023 7:58 AM MRN: [id number] Date of Birth: Jan 28, 1956 Attending MD: Gennette Pac , MD, [id number] CSN: [id number] Age: 67 Admit Type: Outpatient Procedure:                Colonoscopy Indications:              High risk colon cancer surveillance: Personal                            history of colonic polyps Providers:                Gennette Pac, MD, Buel Ream. Thomasena Edis RN, RN,                            Zena Amos Referring MD:              Medicines:                Propofol per Anesthesia Complications:            No immediate complications. Estimated Blood Loss:     Estimated blood loss was minimal. Procedure:                Pre-Anesthesia Assessment:                           - Prior to the procedure, a History and Physical                            was performed, and patient medications and                            allergies were reviewed. The patient's tolerance of                            previous anesthesia was also reviewed. The risks                            and benefits of the procedure and the sedation                            options and risks were discussed with the patient.                            All questions were answered, and informed consent                            was obtained. Prior Anticoagulants: The patient has                            taken no anticoagulant or antiplatelet agents. ASA                            Grade Assessment: II - A patient with mild systemic  disease. After reviewing the risks and benefits,                            the patient was deemed in satisfactory condition to                            undergo the procedure.                           After obtaining informed consent, the colonoscope                            was passed under direct vision. Throughout the                            procedure, the  patient's blood pressure, pulse, and                            oxygen saturations were monitored continuously. The                            [id number]) scope was introduced through the                            anus and advanced to the the cecum, identified by                            appendiceal orifice and ileocecal valve. The                            colonoscopy was performed without difficulty. The                            patient tolerated the procedure well. The quality                            of the bowel preparation was adequate. The                            ileocecal valve, appendiceal orifice, and rectum                            were photographed. The colonoscopy was performed                            without difficulty. The patient tolerated the                            procedure well. The quality of the bowel                            preparation was adequate. Scope In: 8:21:14 AM Scope Out: 8:32:48 AM Scope Withdrawal Time: 0 hours 8 minutes 56 seconds  Total Procedure Duration: 0 hours 11 minutes 34 seconds  Findings:      The perianal and digital rectal examinations were normal. 2 cm       submucosal yellowish nodule ascending segment. Positive pillow sign.      Scattered medium-mouthed diverticula were found in the entire colon.      Two sessile polyps were found in the sigmoid colon and ileocecal valve.       The polyps were 3 to 5 mm in size. These polyps were removed with a cold       snare. Resection and retrieval were complete. Estimated blood loss was       minimal.      The exam was otherwise without abnormality on direct and retroflexion       views. Impression:               - Diverticulosis in the entire examined colon.                            Ascending colon lipoma                           - Two 3 to 5 mm polyps in the sigmoid colon and at                            the ileocecal valve, removed with a cold snare.                             Resected and retrieved.                           - The examination was otherwise normal on direct                            and retroflexion views. Moderate Sedation:      Moderate (conscious) sedation was personally administered by an       anesthesia professional. The following parameters were monitored: oxygen       saturation, heart rate, blood pressure, respiratory rate, EKG, adequacy       of pulmonary ventilation, and response to care. Recommendation:           - Patient has a contact number available for                            emergencies. The signs and symptoms of potential                            delayed complications were discussed with the                            patient. Return to normal activities tomorrow.                            Written discharge instructions were provided to the                            patient.                           -  Advance diet as tolerated.                           - Continue present medications.                           - Repeat colonoscopy date to be determined after                            pending pathology results are reviewed for                            surveillance.                           - Return to GI office (date not yet determined). Procedure Code(s):        --- Professional ---                           [phone], Colonoscopy, flexible; with removal of                            tumor(s), polyp(s), or other lesion(s) by snare                            technique Diagnosis Code(s):        --- Professional ---                           Z86.010, Personal history of colonic polyps                           D12.5, Benign neoplasm of sigmoid colon                           D12.0, Benign neoplasm of cecum                           K57.30, Diverticulosis of large intestine without                            perforation or abscess without bleeding CPT copyright 2022 [employer]. All rights  reserved. The codes documented in this report are preliminary and upon coder review may  be revised to meet current compliance requirements. Gerrit Friends. Jeovany Huitron, MD Gennette Pac, MD 07/24/2023 8:51:54 AM This report has been signed electronically. Number of Addenda: 0

## 2023-07-24 NOTE — Anesthesia Postprocedure Evaluation (Signed)
Anesthesia Post Note  Patient: Spencer Serrano  Procedure(s) Performed: COLONOSCOPY WITH PROPOFOL POLYPECTOMY  Patient location during evaluation: PACU Anesthesia Type: General Level of consciousness: awake and alert Pain management: pain level controlled Vital Signs Assessment: post-procedure vital signs reviewed and stable Respiratory status: spontaneous breathing, nonlabored ventilation, respiratory function stable and patient connected to nasal cannula oxygen Cardiovascular status: blood pressure returned to baseline and stable Postop Assessment: no apparent nausea or vomiting Anesthetic complications: no   There were no known notable events for this encounter.   Last Vitals:  Vitals:   07/24/23 0840 07/24/23 0841  BP: (!) 88/62 91/61  Pulse: 79   Resp:    Temp:    SpO2: 94% 93%    Last Pain:  Vitals:   07/24/23 0836  TempSrc: Axillary  PainSc: 0-No pain                 Megha Agnes L Kimmarie Pascale

## 2023-07-25 ENCOUNTER — Encounter (HOSPITAL_COMMUNITY): Payer: Self-pay | Admitting: Internal Medicine

## 2023-07-25 ENCOUNTER — Encounter: Payer: Self-pay | Admitting: Internal Medicine

## 2023-07-25 LAB — SURGICAL PATHOLOGY

## 2023-09-10 DIAGNOSIS — E6609 Other obesity due to excess calories: Secondary | ICD-10-CM | POA: Diagnosis not present

## 2023-09-10 DIAGNOSIS — Z0001 Encounter for general adult medical examination with abnormal findings: Secondary | ICD-10-CM | POA: Diagnosis not present

## 2023-09-10 DIAGNOSIS — R972 Elevated prostate specific antigen [PSA]: Secondary | ICD-10-CM | POA: Diagnosis not present

## 2023-09-10 DIAGNOSIS — I10 Essential (primary) hypertension: Secondary | ICD-10-CM | POA: Diagnosis not present

## 2023-09-10 DIAGNOSIS — Z6831 Body mass index (BMI) 31.0-31.9, adult: Secondary | ICD-10-CM | POA: Diagnosis not present

## 2023-09-10 DIAGNOSIS — R7309 Other abnormal glucose: Secondary | ICD-10-CM | POA: Diagnosis not present

## 2023-09-10 DIAGNOSIS — Z1331 Encounter for screening for depression: Secondary | ICD-10-CM | POA: Diagnosis not present

## 2023-09-10 DIAGNOSIS — E7849 Other hyperlipidemia: Secondary | ICD-10-CM | POA: Diagnosis not present

## 2023-09-10 DIAGNOSIS — E782 Mixed hyperlipidemia: Secondary | ICD-10-CM | POA: Diagnosis not present

## 2023-09-16 ENCOUNTER — Encounter: Payer: Self-pay | Admitting: Emergency Medicine

## 2023-09-16 ENCOUNTER — Ambulatory Visit
Admission: EM | Admit: 2023-09-16 | Discharge: 2023-09-16 | Disposition: A | Discharge status: Home / Self Care | Attending: Nurse Practitioner | Admitting: Nurse Practitioner

## 2023-09-16 DIAGNOSIS — Z8739 Personal history of other diseases of the musculoskeletal system and connective tissue: Secondary | ICD-10-CM

## 2023-09-16 DIAGNOSIS — M25471 Effusion, right ankle: Secondary | ICD-10-CM

## 2023-09-16 DIAGNOSIS — M25571 Pain in right ankle and joints of right foot: Secondary | ICD-10-CM | POA: Diagnosis not present

## 2023-09-16 MED ORDER — PREDNISONE 20 MG PO TABS: 40.0000 mg | ORAL_TABLET | Freq: Every day | ORAL | 0 refills | Status: AC

## 2023-09-16 MED ORDER — DEXAMETHASONE SODIUM PHOSPHATE 10 MG/ML IJ SOLN
10.0000 mg | INTRAMUSCULAR | Status: AC
  Administered 2023-09-16: 10 mg via INTRAMUSCULAR

## 2023-09-16 NOTE — Discharge Instructions (Signed)
 Uric acid level is pending.  You will be contacted if the pending test result is abnormal.  You also access to results via MyChart. Take medication as prescribed. May take over-the-counter Tylenol for breakthrough pain or discomfort. Continue applying ice to the affected area.  Apply for 20 minutes, remove for 1 hour, repeat as needed. Continue elevating the right lower lower extremity is much as possible to help with pain and swelling. Recommend following up with your PCP within the next 7 to 10 days for reevaluation. Follow-up as needed.

## 2023-09-16 NOTE — ED Triage Notes (Signed)
 Right foot pain and swelling since Sunday.  States he thinks he may have gout in that foot.

## 2023-09-16 NOTE — ED Provider Notes (Signed)
 [facility]    CSN: [id number] Arrival date & time: 09/16/23  0844      History   Chief Complaint No chief complaint on file.   HPI Spencer Serrano is a 68 y.o. male.   The history is provided by the patient.   Patient was complaints of pain and swelling to the right foot.  Symptoms have been present for the past several days.  Patient denies injury, or trauma.  Patient reports prior history of gout.  States that he was taking allopurinol, but when he went for his annual physical last week, the doctor was concerned about his kidney function and asked him to stop the medication.  Patient denies fever, chills, chest pain, abdominal pain, nausea, vomiting, or diarrhea.  States that he has been elevating the foot and applying ice.  Patient reports that he does not follow any strict diet.  Past Medical History:  Diagnosis Date   Gout    History of kidney stones    Hypercholesteremia    Hypertension     Patient Active Problem List   Diagnosis Date Noted   Kidney stones 12/16/2022   History of colonic polyps    Diverticulosis of colon without hemorrhage     Past Surgical History:  Procedure Laterality Date   CARDIAC CATHETERIZATION     COLONOSCOPY     COLONOSCOPY N/A 10/17/2014   Procedure: COLONOSCOPY;  Surgeon: Corbin Ade, MD;  Location: [facility];  Service: Endoscopy;  Laterality: N/A;  830   COLONOSCOPY N/A 01/14/2018   Procedure: COLONOSCOPY;  Surgeon: Corbin Ade, MD;  Location: [facility];  Service: Endoscopy;  Laterality: N/A;  9:00   COLONOSCOPY WITH PROPOFOL N/A 07/24/2023   Procedure: COLONOSCOPY WITH PROPOFOL;  Surgeon: Corbin Ade, MD;  Location: [facility];  Service: Endoscopy;  Laterality: N/A;  900am, asa 2   EXTRACORPOREAL SHOCK WAVE LITHOTRIPSY Left 12/03/2022   Procedure: EXTRACORPOREAL SHOCK WAVE LITHOTRIPSY (ESWL);  Surgeon: Malen Gauze, MD;  Location: [facility];  Service: Urology;  Laterality: Left;   POLYPECTOMY   07/24/2023   Procedure: POLYPECTOMY;  Surgeon: Corbin Ade, MD;  Location: [facility];  Service: Endoscopy;;       Home Medications    Prior to Admission medications   Medication Sig Start Date End Date Taking? Authorizing Provider  predniSONE (DELTASONE) 20 MG tablet Take 2 tablets (40 mg total) by mouth daily with breakfast for 5 days. 09/16/23 09/21/23 Yes Leath-Warren, Sadie Haber, NP  lisinopril (PRINIVIL,ZESTRIL) 20 MG tablet Take 20 mg by mouth 2 (two) times daily. 07/26/14   [provider]  rosuvastatin (CRESTOR) 20 MG tablet Take 20 mg by mouth daily. 09/09/22   [provider]    Family History History reviewed. No pertinent family history.  Social History Social History   Tobacco Use   Smoking status: Never   Smokeless tobacco: Never  Vaping Use   Vaping status: Never Used  Substance Use Topics   Alcohol use: Yes    Alcohol/week: 0.0 standard drinks of alcohol    Comment: "Very Rarely"   Drug use: No     Allergies   Penicillins   Review of Systems Review of Systems Per HPI  Physical Exam Triage Vital Signs ED Triage Vitals [09/16/23 0919]  Encounter Vitals Group     BP 127/70     Systolic BP Percentile      Diastolic BP Percentile      Pulse Rate  80     Resp 18     Temp 98.8 F (37.1 C)     Temp Source Oral     SpO2 93 %     Weight      Height      Head Circumference      Peak Flow      Pain Score 2     Pain Loc      Pain Education      Exclude from Growth Chart    No data found.  Updated Vital Signs BP 127/70 (BP Location: Right Arm)   Pulse 80   Temp 98.8 F (37.1 C) (Oral)   Resp 18   SpO2 93%   Visual Acuity Right Eye Distance:   Left Eye Distance:   Bilateral Distance:    Right Eye Near:   Left Eye Near:    Bilateral Near:     Physical Exam Vitals and nursing note reviewed.  Constitutional:      General: He is not in acute distress.    Appearance: Normal appearance.  HENT:     Head:  Normocephalic.  Eyes:     Extraocular Movements: Extraocular movements intact.     Pupils: Pupils are equal, round, and reactive to light.  Cardiovascular:     Rate and Rhythm: Normal rate and regular rhythm.     Pulses: Normal pulses.     Heart sounds: Normal heart sounds.  Pulmonary:     Effort: Pulmonary effort is normal. No respiratory distress.     Breath sounds: Normal breath sounds. No stridor. No wheezing, rhonchi or rales.  Abdominal:     General: Bowel sounds are normal.     Palpations: Abdomen is soft.     Tenderness: There is no abdominal tenderness.  Musculoskeletal:     Cervical back: Normal range of motion.     Right ankle: Swelling present. No deformity or ecchymosis. Tenderness present over the lateral malleolus and medial malleolus. Decreased range of motion (d/t pain). Normal pulse.     Right foot: Normal range of motion. Swelling present. No deformity.     Comments: Right ankle with erythema and swelling.  Ankle is warm to palpation.  There is no obvious deformity or ecchymosis present.  Lymphadenopathy:     Cervical: No cervical adenopathy.  Skin:    General: Skin is warm and dry.  Neurological:     General: No focal deficit present.     Mental Status: He is alert and oriented to person, place, and time.  Psychiatric:        Mood and Affect: Mood normal.        Behavior: Behavior normal.      UC Treatments / Results  Labs (all labs ordered are listed, but only abnormal results are displayed) Labs Reviewed  URIC ACID    EKG   Radiology No results found.  Procedures Procedures (including critical care time)  Medications Ordered in UC Medications  dexamethasone (DECADRON) injection 10 mg (10 mg Intramuscular Given 09/16/23 0936)    Initial Impression / Assessment and Plan / UC Course  I have reviewed the triage vital signs and the nursing notes.  Pertinent labs & imaging results that were available during my care of the patient were reviewed  by me and considered in my medical decision making (see chart for details).  Uric acid level is pending.  Decadron 10 mg IM administered for inflammation.  Will start patient on prednisone 40 mg for the  next 5 days for gout flare.  Supportive care recommendations were provided and discussed with the patient to include over-the-counter Tylenol for pain or discomfort, dietary changes, continuing ice, and elevation for pain and swelling.  Patient was advised to follow-up with his PCP for reevaluation.  Patient was in agreement with this plan of care and verbalized understanding.  All questions were answered.  Patient stable for discharge.  Final Clinical Impressions(s) / UC Diagnoses   Final diagnoses:  Pain and swelling of right ankle  History of gout     Discharge Instructions      Uric acid level is pending.  You will be contacted if the pending test result is abnormal.  You also access to results via MyChart. Take medication as prescribed. May take over-the-counter Tylenol for breakthrough pain or discomfort. Continue applying ice to the affected area.  Apply for 20 minutes, remove for 1 hour, repeat as needed. Continue elevating the right lower lower extremity is much as possible to help with pain and swelling. Recommend following up with your PCP within the next 7 to 10 days for reevaluation. Follow-up as needed.     ED Prescriptions     Medication Sig Dispense Auth. Provider   predniSONE (DELTASONE) 20 MG tablet Take 2 tablets (40 mg total) by mouth daily with breakfast for 5 days. 10 tablet Leath-Warren, Sadie Haber, NP      PDMP not reviewed this encounter.   Abran Cantor, NP 09/16/23 [phone]

## 2023-09-17 LAB — URIC ACID: Uric Acid: 6.6 mg/dL (ref 3.8–8.4)

## 2023-11-24 DIAGNOSIS — Z85828 Personal history of other malignant neoplasm of skin: Secondary | ICD-10-CM | POA: Diagnosis not present

## 2023-11-24 DIAGNOSIS — Q825 Congenital non-neoplastic nevus: Secondary | ICD-10-CM | POA: Diagnosis not present

## 2023-11-24 DIAGNOSIS — L570 Actinic keratosis: Secondary | ICD-10-CM | POA: Diagnosis not present

## 2023-12-15 DIAGNOSIS — N289 Disorder of kidney and ureter, unspecified: Secondary | ICD-10-CM | POA: Diagnosis not present

## 2023-12-15 DIAGNOSIS — N200 Calculus of kidney: Secondary | ICD-10-CM | POA: Diagnosis not present

## 2024-05-25 DIAGNOSIS — L815 Leukoderma, not elsewhere classified: Secondary | ICD-10-CM | POA: Diagnosis not present

## 2024-05-25 DIAGNOSIS — L570 Actinic keratosis: Secondary | ICD-10-CM | POA: Diagnosis not present

## 2024-05-25 DIAGNOSIS — Q825 Congenital non-neoplastic nevus: Secondary | ICD-10-CM | POA: Diagnosis not present

## 2024-05-25 DIAGNOSIS — Z85828 Personal history of other malignant neoplasm of skin: Secondary | ICD-10-CM | POA: Diagnosis not present
